# Patient Record
Sex: Male | Born: 1990 | Race: White | Hispanic: No | Marital: Single | Smoking: Never smoker
Health system: Southern US, Community
[De-identification: ages and names within clinical notes are randomized; demographics above are authoritative.]

## PROBLEM LIST (undated history)

## (undated) DIAGNOSIS — E669 Obesity, unspecified: Secondary | ICD-10-CM

## (undated) DIAGNOSIS — E785 Hyperlipidemia, unspecified: Secondary | ICD-10-CM

## (undated) DIAGNOSIS — K760 Fatty (change of) liver, not elsewhere classified: Secondary | ICD-10-CM

## (undated) DIAGNOSIS — I10 Essential (primary) hypertension: Secondary | ICD-10-CM

## (undated) DIAGNOSIS — G4730 Sleep apnea, unspecified: Secondary | ICD-10-CM

## (undated) DIAGNOSIS — E119 Type 2 diabetes mellitus without complications: Secondary | ICD-10-CM

## (undated) HISTORY — DX: Essential (primary) hypertension: I10

## (undated) HISTORY — DX: Hyperlipidemia, unspecified: E78.5

## (undated) HISTORY — DX: Fatty (change of) liver, not elsewhere classified: K76.0

## (undated) HISTORY — DX: Type 2 diabetes mellitus without complications: E11.9

## (undated) HISTORY — DX: Obesity, unspecified: E66.9

## (undated) HISTORY — DX: Sleep apnea, unspecified: G47.30

---

## 2016-03-28 HISTORY — PX: KNEE SURGERY: SHX244

## 2017-08-16 NOTE — Progress Notes (Signed)
Patient ID: Carlos Wilcox, male    DOB: 29-Apr-1991, 26 y.o.   MRN: [id number]  Chief Complaint  Patient presents with  . Nausea    Allergies Patient has no allergy information on record.  Subjective:   Carlos Wilcox is a 26 y.o. male who presents to [facility] today.  HPI Here to establish care and reports that over the past 16 days has not been feeling good. Reports that has felt nauseated for about three days. Had been out of work since 08/06/17. Works 12 hours shifts. Since that time has had nausea and diarrhea. The diarrhea would initially be 5-6 times a day and it has gotten better now. Just has the nausea and vomiting now. Starts to cough b/c feel nauseated and then vomits. Has been coughing a lot and gets pos-tussive emesis.  Saw Dr. Burnett Harry since this has started and was told had bronchitis, flu, and stomach virus. Was put on antibiotics. Has been taking cough syrup. Thinks he was put on ceftin. Was given phenergan suppository which made him sleepy. Did not do any testing of stool. Vomiting is only associated with coughing now. Now is not vomiting on a daily basis, yesterday had some dry heaves several times and a little bit of emesis. Has had a decreased appetite. Stomach does not hurt. No rashes. Does not hurt in chest.  Stool is back to normal. Stools is twice a day, formed. Nausea is about the same as when it started. The cough started back when the nausea and diarrhea started. Reports at Dr. Burnett Harry did a swab for flu and it was positive. He did a CXR and no pneumonia. They did not check blood work. Was written out of work from the 9th-20th and yesterday was the first day he was supposed to be back but has not been feeling well. Due to work constraints did not feel like could go back to work. The worst thing now is the coughing and the vomiting that comes from it. Does not want to vomit on or around an inmate.     History reviewed. No pertinent past medical  history.  Past Surgical History:  Procedure Laterality Date  . KNEE SURGERY  03/2016    Family History  Problem Relation Age of Onset  . Hypertension Mother   . Hypertension Father      Social History   Social History  . Marital status: Single    Spouse name: N/A  . Number of children: N/A  . Years of education: N/A   Occupational History  . Designer, industrial/product    Social History Main Topics  . Smoking status: Never Smoker  . Smokeless tobacco: Never Used  . Alcohol use Yes     Comment: social drinker. Less than 1 drink per week  . Drug use: No  . Sexual activity: No   Other Topics Concern  . None   Social History Narrative   Lives with parents. Grew up in [place], [place]. Worked at [employer] for a year. Eats all food groups.     Review of Systems  Constitutional: Positive for appetite change, chills and fatigue. Negative for fever and unexpected weight change.       Has been having some chills. No fevers.   HENT: Negative for ear pain, hearing loss, nosebleeds, sinus pressure and trouble swallowing.   Eyes: Negative for visual disturbance.  Respiratory: Positive for cough. Negative for choking, chest tightness, shortness of breath, wheezing and stridor.  Coughing up yellow/green mucus. The amount is better but persistent.   Cardiovascular: Negative for chest pain, palpitations and leg swelling.  Gastrointestinal: Positive for abdominal pain, nausea and vomiting. Negative for blood in stool, constipation and diarrhea.  Genitourinary: Negative for decreased urine volume, difficulty urinating, dysuria, frequency and urgency.  Musculoskeletal: Negative for arthralgias and back pain.  Skin: Negative for color change and rash.  Neurological: Negative for dizziness, tremors, weakness and light-headedness.  Hematological: Negative for adenopathy. Does not bruise/bleed easily.  Psychiatric/Behavioral: Negative for decreased concentration and dysphoric  mood. The patient is not nervous/anxious.      Objective:   BP 130/86 (BP Location: Left Arm, Patient Position: Sitting, Cuff Size: Normal)   Pulse 98   Temp 97.6 F (36.4 C) (Other (Comment))   Resp 16   Ht 6' 2"  (1.88 m)   Wt (!) 302 lb 12 oz (137.3 kg)   SpO2 100%   BMI 38.87 kg/m   Physical Exam  Constitutional: He is oriented to person, place, and time. He appears well-developed and well-nourished.  HENT:  Head: Normocephalic and atraumatic.  Right Ear: External ear normal.  Left Ear: External ear normal.  Nose: Nose normal.  Mouth/Throat: Oropharynx is clear and moist.  Eyes: Pupils are equal, round, and reactive to light. Conjunctivae and EOM are normal. No scleral icterus.  Neck: Normal range of motion. Neck supple. No JVD present. No tracheal deviation present. No thyromegaly present.  Cardiovascular: Normal rate, regular rhythm and normal heart sounds.   Pulses:      Dorsalis pedis pulses are 2+ on the right side, and 2+ on the left side.  Pulmonary/Chest: Effort normal and breath sounds normal. No stridor. He has no wheezes.  Abdominal: Soft. Bowel sounds are normal. He exhibits no distension and no mass. There is hepatosplenomegaly. There is tenderness. There is no rebound and no guarding.  Tenderness at the liver edge and below rib cage on the right side. Questionable spleen enlargement. Liver edge palpable and tender.   Musculoskeletal: Normal range of motion. He exhibits no edema.  Lymphadenopathy:    He has no cervical adenopathy.  Neurological: He is alert and oriented to person, place, and time.  Skin: Skin is warm, dry and intact. Capillary refill takes less than 2 seconds. No rash noted. No erythema. No pallor.  Psychiatric: He has a normal mood and affect. His behavior is normal. Thought content normal.  Vitals reviewed.    Assessment and Plan   1. Long discussion and review of patient symptoms. Uncertain etiology but suspect viral syndrome and  questionable liver/spleen enlargement. Possibly EBV or URI with some systemic manifestations. Patient unsure of initial antibiotic treatment, so will treat with zpack to cover for typical and atypical bacteria in light of exposures with job. Discussed with patient that lungs sound good and suspect resolving bronchitis with post-tussive emesis possibly exacerbated by nausea thereby increasing gag reflex. Will check for viral hepatitis. Due to questionable HSM, check u/s and check EBV serology.  Due to vomiting and family history, check blood sugars and electrolytes. Counseled regarding worrisome s/s and if develop to the ED. Patient to follow up on Friday or sooner if needed.   Nausea and vomiting, intractability of vomiting not specified, unspecified vomiting type  - Basic metabolic panel - CBC with Differential/Platelet - Hepatic function panel - Hepatitis panel, acute  2. Hepatosplenomegaly  - US Abdomen Complete; Future - Epstein-Barr virus VCA antibody panel  3. Cough - azithromycin (ZITHROMAX) 250 MG tablet;  Take two pills for the first day and then one pill a day for four days.  Dispense: 6 tablet; Refill: 0  4. Viral syndrome  - Epstein-Barr virus VCA antibody panel  5. Hyperglycemia  - Hemoglobin A1c    No Follow-up on file. Caren Macadam, MD 08/22/2017

## 2017-08-22 ENCOUNTER — Encounter: Payer: Self-pay | Admitting: Family Medicine

## 2017-08-22 ENCOUNTER — Other Ambulatory Visit (HOSPITAL_COMMUNITY)
Admission: RE | Admit: 2017-08-22 | Discharge: 2017-08-22 | Disposition: A | Discharge status: Home / Self Care | Payer: BC Managed Care – PPO | Source: Ambulatory Visit | Attending: Family Medicine | Admitting: Family Medicine

## 2017-08-22 ENCOUNTER — Ambulatory Visit (INDEPENDENT_AMBULATORY_CARE_PROVIDER_SITE_OTHER): Payer: BC Managed Care – PPO | Admitting: Family Medicine

## 2017-08-22 ENCOUNTER — Ambulatory Visit (HOSPITAL_COMMUNITY)
Admission: RE | Admit: 2017-08-22 | Discharge: 2017-08-22 | Disposition: A | Discharge status: Home / Self Care | Payer: BC Managed Care – PPO | Source: Ambulatory Visit | Attending: Family Medicine | Admitting: Family Medicine

## 2017-08-22 ENCOUNTER — Encounter (INDEPENDENT_AMBULATORY_CARE_PROVIDER_SITE_OTHER): Payer: Self-pay

## 2017-08-22 VITALS — BP 130/86 | HR 98 | Temp 97.6°F | Resp 16 | Ht 74.0 in | Wt 302.8 lb

## 2017-08-22 DIAGNOSIS — K760 Fatty (change of) liver, not elsewhere classified: Secondary | ICD-10-CM | POA: Insufficient documentation

## 2017-08-22 DIAGNOSIS — R112 Nausea with vomiting, unspecified: Secondary | ICD-10-CM

## 2017-08-22 DIAGNOSIS — B349 Viral infection, unspecified: Secondary | ICD-10-CM | POA: Diagnosis not present

## 2017-08-22 DIAGNOSIS — R05 Cough: Secondary | ICD-10-CM

## 2017-08-22 DIAGNOSIS — R059 Cough, unspecified: Secondary | ICD-10-CM

## 2017-08-22 DIAGNOSIS — R162 Hepatomegaly with splenomegaly, not elsewhere classified: Secondary | ICD-10-CM | POA: Insufficient documentation

## 2017-08-22 DIAGNOSIS — R739 Hyperglycemia, unspecified: Secondary | ICD-10-CM

## 2017-08-22 LAB — CBC WITH DIFFERENTIAL/PLATELET
Basophils Absolute: 0.1 10*3/uL (ref 0.0–0.1)
Basophils Relative: 1 %
Eosinophils Absolute: 0.6 10*3/uL (ref 0.0–0.7)
Eosinophils Relative: 7 %
HCT: 47.6 % (ref 39.0–52.0)
Hemoglobin: 16.7 g/dL (ref 13.0–17.0)
Lymphocytes Relative: 30 %
Lymphs Abs: 2.6 10*3/uL (ref 0.7–4.0)
MCH: 31.2 pg (ref 26.0–34.0)
MCHC: 35.1 g/dL (ref 30.0–36.0)
MCV: 88.8 fL (ref 78.0–100.0)
Monocytes Absolute: 0.9 10*3/uL (ref 0.1–1.0)
Monocytes Relative: 10 %
Neutro Abs: 4.7 10*3/uL (ref 1.7–7.7)
Neutrophils Relative %: 52 %
Platelets: 270 10*3/uL (ref 150–400)
RBC: 5.36 MIL/uL (ref 4.22–5.81)
RDW: 12.3 % (ref 11.5–15.5)
WBC: 8.9 10*3/uL (ref 4.0–10.5)

## 2017-08-22 LAB — BASIC METABOLIC PANEL
Anion gap: 9 (ref 5–15)
BUN: 11 mg/dL (ref 6–20)
CO2: 24 mmol/L (ref 22–32)
Calcium: 9.7 mg/dL (ref 8.9–10.3)
Chloride: 102 mmol/L (ref 101–111)
Creatinine, Ser: 0.83 mg/dL (ref 0.61–1.24)
GFR calc Af Amer: >60 mL/min (ref >60)
GFR calc non Af Amer: >60 mL/min (ref >60)
Glucose, Bld: 161 mg/dL — ABNORMAL HIGH (ref 65–99)
Potassium: 3.7 mmol/L (ref 3.5–5.1)
Sodium: 135 mmol/L (ref 135–145)

## 2017-08-22 LAB — HEMOGLOBIN A1C
Hgb A1c MFr Bld: 6.9 % — ABNORMAL HIGH (ref 4.8–5.6)
Mean Plasma Glucose: 151.33 mg/dL

## 2017-08-22 LAB — HEPATIC FUNCTION PANEL
ALT: 193 U/L — ABNORMAL HIGH (ref 17–63)
AST: 94 U/L — ABNORMAL HIGH (ref 15–41)
Albumin: 4.4 g/dL (ref 3.5–5.0)
Alkaline Phosphatase: 51 U/L (ref 38–126)
Bilirubin, Direct: 0.1 mg/dL (ref 0.1–0.5)
Indirect Bilirubin: 0.4 mg/dL (ref 0.3–0.9)
Total Bilirubin: 0.5 mg/dL (ref 0.3–1.2)
Total Protein: 8 g/dL (ref 6.5–8.1)

## 2017-08-22 MED ORDER — AZITHROMYCIN 250 MG PO TABS: ORAL_TABLET | ORAL | 0 refills | Status: DC

## 2017-08-23 ENCOUNTER — Telehealth: Payer: Self-pay | Admitting: Family Medicine

## 2017-08-23 LAB — HEPATITIS PANEL, ACUTE
HCV Ab: <0.1 s/co ratio (ref 0.0–0.9)
Hep A IgM: NEGATIVE
Hep B C IgM: NEGATIVE
Hepatitis B Surface Ag: NEGATIVE

## 2017-08-23 LAB — EPSTEIN-BARR VIRUS VCA ANTIBODY PANEL
EBV Early Antigen Ab, IgG: <9 U/mL (ref 0.0–8.9)
EBV NA IgG: <18 U/mL (ref 0.0–17.9)
EBV VCA IgG: <18 U/mL (ref 0.0–17.9)
EBV VCA IgM: <36 U/mL (ref 0.0–35.9)

## 2017-08-23 NOTE — Telephone Encounter (Signed)
Called patient regarding message below. No answer, left generic message for patient to return call.

## 2017-08-23 NOTE — Telephone Encounter (Signed)
-----   Message from Caren Macadam, MD sent at 08/23/2017  8:25 AM EDT ----- Please call patient and advise that his spleen is normal size. His liver is enlarged. His labs that are back do reveal that he has diabetes type 2. Please schedule him to follow up tomorrow to discuss his labs, fill out paperwork, and discuss the needed interventions. Gwen Her. Mannie Stabile, MD

## 2017-08-25 ENCOUNTER — Ambulatory Visit (HOSPITAL_COMMUNITY): Payer: BC Managed Care – PPO

## 2017-08-25 NOTE — Telephone Encounter (Signed)
Spoke to father, on [place], advised of labs and ask that he have patient call for appt

## 2017-08-30 ENCOUNTER — Ambulatory Visit (INDEPENDENT_AMBULATORY_CARE_PROVIDER_SITE_OTHER): Payer: BC Managed Care – PPO | Admitting: Family Medicine

## 2017-08-30 ENCOUNTER — Encounter: Payer: Self-pay | Admitting: Family Medicine

## 2017-08-30 VITALS — BP 126/82 | HR 82 | Resp 20 | Ht 74.0 in | Wt 301.0 lb

## 2017-08-30 DIAGNOSIS — E1165 Type 2 diabetes mellitus with hyperglycemia: Secondary | ICD-10-CM | POA: Diagnosis not present

## 2017-08-30 DIAGNOSIS — K760 Fatty (change of) liver, not elsewhere classified: Secondary | ICD-10-CM | POA: Diagnosis not present

## 2017-08-30 LAB — HEPATIC FUNCTION PANEL
AG Ratio: 1.8 (calc) (ref 1.0–2.5)
ALT: 226 U/L — ABNORMAL HIGH (ref 9–46)
AST: 139 U/L — ABNORMAL HIGH (ref 10–40)
Albumin: 4.6 g/dL (ref 3.6–5.1)
Alkaline phosphatase (APISO): 53 U/L (ref 40–115)
Bilirubin, Direct: 0.2 mg/dL (ref 0.0–0.2)
Globulin: 2.6 g/dL (calc) (ref 1.9–3.7)
Indirect Bilirubin: 0.8 mg/dL (calc) (ref 0.2–1.2)
Total Bilirubin: 1 mg/dL (ref 0.2–1.2)
Total Protein: 7.2 g/dL (ref 6.1–8.1)

## 2017-08-30 LAB — POC URINALSYSI DIPSTICK (AUTOMATED)
Bilirubin, UA: NEGATIVE
Blood, UA: NEGATIVE
Glucose, UA: NEGATIVE
Leukocytes, UA: NEGATIVE
Nitrite, UA: NEGATIVE
Protein, UA: NEGATIVE
Spec Grav, UA: 1.015 (ref 1.010–1.025)
Urobilinogen, UA: 0.2 E.U./dL
pH, UA: 5.5 (ref 5.0–8.0)

## 2017-08-30 LAB — POCT CBG (FASTING - GLUCOSE)-MANUAL ENTRY: Glucose Fasting, POC: 219 mg/dL — AB (ref 70–99)

## 2017-08-30 MED ORDER — METFORMIN HCL 500 MG PO TABS: ORAL_TABLET | ORAL | 1 refills | Status: DC

## 2017-08-30 NOTE — Progress Notes (Signed)
Patient ID: Carlos Wilcox, male    DOB: [birth year], 26 y.o.   MRN: [id number]  Chief Complaint  Patient presents with  . Results    going over labs    Allergies Patient has no allergy information on record.  Subjective:   Carlos Wilcox is a 26 y.o. male who presents to [facility] today.  HPI Here for follow up. Has been doing better. Has not been vomiting. Has not had diarrhea. Energy is better but still feels tired and sluggish. Has not been doing much. Sleeping during the day and stays up during the day b/c works night shift. Here to discuss labs. When I told patient that he was diabetic he said that he forgot to tell me that had been diagnosed with diabetes several years ago and had an episode of DKA and was hospitalized. Sugars at that time were in the 600s. Was on insulin for several months and then reports that his body recovered and he did not need it anymore. Was on metformin and took it and then sugars came down. Reports that he did not want that information given to his job so had left it off. Family history of diabetes on paternal side.  Eats a lot of frozen foods-eats taquitos, bagel bites, chicken alfredo. Weight has not been stable. Exercises three times a week until was not feeling well.    Diabetes  He presents for his initial diabetic visit. He has type 2 diabetes mellitus. No MedicAlert identification noted. His disease course has been fluctuating. Pertinent negatives for hypoglycemia include no confusion, dizziness, headaches, hunger, mood changes, nervousness/anxiousness, pallor, seizures, sleepiness or speech difficulty. Pertinent negatives for diabetes include no blurred vision, no chest pain, no fatigue, no foot paresthesias, no foot ulcerations, no polydipsia, no polyphagia, no polyuria, no visual change, no weakness and no weight loss. There are no hypoglycemic complications. There are no diabetic complications. Risk factors for coronary artery disease  include family history, diabetes mellitus, male sex, obesity and sedentary lifestyle. When asked about current treatments, none were reported. His weight is stable. He is following a generally healthy diet. When asked about meal planning, he reported none. He has not had a previous visit with a dietitian. He participates in exercise three times a week. An ACE inhibitor/angiotensin II receptor blocker is not being taken. He does not see a podiatrist.Eye exam is not current.    History reviewed. No pertinent past medical history.  Past Surgical History:  Procedure Laterality Date  . KNEE SURGERY  03/2016    Family History  Problem Relation Age of Onset  . Hypertension Mother   . Hypertension Father      Social History   Social History  . Marital status: Single    Spouse name: N/A  . Number of children: N/A  . Years of education: N/A   Occupational History  . Designer, industrial/product    Social History Main Topics  . Smoking status: Never Smoker  . Smokeless tobacco: Never Used  . Alcohol use Yes     Comment: social drinker. Less than 1 drink per week  . Drug use: No  . Sexual activity: No   Other Topics Concern  . None   Social History Narrative   Lives with parents. Grew up in [place], [place]. Worked at [employer] for a year. Eats all food groups.     Review of Systems  Constitutional: Negative for activity change, chills, diaphoresis, fatigue, fever, unexpected weight change and  weight loss.       Feels tired but reports that believes it is b/c he has not been doing anything. Supposed to go back to work today.   Eyes: Negative for blurred vision and visual disturbance.  Respiratory: Negative for cough, choking, chest tightness and shortness of breath.   Cardiovascular: Negative for chest pain, palpitations and leg swelling.  Gastrointestinal: Negative for abdominal distention, blood in stool, constipation, diarrhea, rectal pain and vomiting.       Nausea is  much better. Has not had it in days but had a twinge yesterday.   Endocrine: Negative for polydipsia, polyphagia and polyuria.  Genitourinary: Negative for dysuria.  Musculoskeletal: Negative for arthralgias, back pain and myalgias.  Skin: Negative for pallor and rash.  Neurological: Negative for dizziness, seizures, syncope, speech difficulty, weakness, light-headedness, numbness and headaches.  Hematological: Negative for adenopathy. Does not bruise/bleed easily.  Psychiatric/Behavioral: Negative for agitation, behavioral problems, confusion, dysphoric mood and sleep disturbance. The patient is not nervous/anxious.      Objective:   BP 126/82   Pulse 82   Resp 20   Ht 6' 2"  (1.88 m)   Wt (!) 301 lb (136.5 kg)   SpO2 98%   BMI 38.65 kg/m   Physical Exam  Constitutional: He is oriented to person, place, and time. He appears well-developed and well-nourished.  HENT:  Head: Normocephalic and atraumatic.  Nose: Nose normal.  Mouth/Throat: Oropharynx is clear and moist.  Eyes: Pupils are equal, round, and reactive to light. EOM are normal.  Neck: Normal range of motion. Neck supple. No JVD present. No tracheal deviation present. No thyromegaly present.  Cardiovascular: Normal rate, regular rhythm and normal heart sounds.  Exam reveals no friction rub.   No murmur heard. Pulmonary/Chest: Effort normal and breath sounds normal. No respiratory distress. He has no wheezes.  Abdominal: Soft. Bowel sounds are normal. He exhibits no distension and no mass. There is no tenderness. There is no rebound and no guarding.  Musculoskeletal: Normal range of motion. He exhibits no edema.  Lymphadenopathy:    He has no cervical adenopathy.  Neurological: He is alert and oriented to person, place, and time. No cranial nerve deficit. Coordination normal.  Skin: Skin is warm and dry.  Psychiatric: He has a normal mood and affect. His behavior is normal. Judgment and thought content normal.  Vitals  reviewed.    Assessment and Plan   1. Uncontrolled type 2 diabetes mellitus with hyperglycemia (HCC) Lifestyle modifications discussed with patient including a diet emphasizing vegetables, fruits, and whole grains. Limiting intake of sodium to less than 2,400 mg per day.  Recommendations discussed include consuming low-fat dairy products, poultry, fish, legumes, non-tropical vegetable oils, and nuts; and limiting intake of sweets, sugar-sweetened beverages, and red meat. Discussed following a plan such as the Dietary Approaches to Stop Hypertension (DASH) diet. Patient to read up on this diet.  Diet and weight loss discussed in detail. Start medication as directed and follow up as directed.  - metFORMIN (GLUCOPHAGE) 500 MG tablet; Take one pill a day for two weeks then increase one pill twice a day  Dispense: 60 tablet; Refill: 1 - Ambulatory referral to Nutrition and Diabetic Education - POCT CBG (Fasting - Glucose) - POCT Urinalysis Dipstick (Automated) Did POCT for urine an blood sugar to make sure that values were not to high. Patient will follow up in 1 month. At that time will check lipids, and do other diabetic testing. Gets flu shot at work.  Forms completed that he could return to work.  -Discussed metformin side effects. Will titrate up dose slowly to decrease GI side effects.  2. NAFLD (nonalcoholic fatty liver disease) Discussed the risks of this including liver failure due to this. Discussed that needs to loose weight, get sugars controlled, and monitor. Check labs today. U/S reviewed with patient.  - Hepatic function panel  3. Morbid obesity ([place]) Discussed. Dietary changes discussed.  - Ambulatory referral to Nutrition and Diabetic Education  4.  Recent Influenza/Viral syndrome: records reviewed from Dr. Burnett Harry. Discussed with patient. Will be scanned into chart.   Return in about 4 weeks (around 09/27/2017) for diabetes/liver. Caren Macadam, MD 08/30/2017

## 2017-08-30 NOTE — Patient Instructions (Signed)
Diabetes Mellitus and Food It is important for you to manage your blood sugar (glucose) level. Your blood glucose level can be greatly affected by what you eat. Eating healthier foods in the appropriate amounts throughout the day at about the same time each day will help you control your blood glucose level. It can also help slow or prevent worsening of your diabetes mellitus. Healthy eating may even help you improve the level of your blood pressure and reach or maintain a healthy weight. General recommendations for healthful eating and cooking habits include:  Eating meals and snacks regularly. Avoid going long periods of time without eating to lose weight.  Eating a diet that consists mainly of plant-based foods, such as fruits, vegetables, nuts, legumes, and whole grains.  Using low-heat cooking methods, such as baking, instead of high-heat cooking methods, such as deep frying.  Work with your dietitian to make sure you understand how to use the Nutrition Facts information on food labels. How can food affect me? Carbohydrates Carbohydrates affect your blood glucose level more than any other type of food. Your dietitian will help you determine how many carbohydrates to eat at each meal and teach you how to count carbohydrates. Counting carbohydrates is important to keep your blood glucose at a healthy level, especially if you are using insulin or taking certain medicines for diabetes mellitus. Alcohol Alcohol can cause sudden decreases in blood glucose (hypoglycemia), especially if you use insulin or take certain medicines for diabetes mellitus. Hypoglycemia can be a life-threatening condition. Symptoms of hypoglycemia (sleepiness, dizziness, and disorientation) are similar to symptoms of having too much alcohol. If your health care provider has given you approval to drink alcohol, do so in moderation and use the following guidelines:  Women should not have more than one drink per day, and men  should not have more than two drinks per day. One drink is equal to: ? 12 oz of beer. ? 5 oz of wine. ? 1 oz of hard liquor.  Do not drink on an empty stomach.  Keep yourself hydrated. Have water, diet soda, or unsweetened iced tea.  Regular soda, juice, and other mixers might contain a lot of carbohydrates and should be counted.  What foods are not recommended? As you make food choices, it is important to remember that all foods are not the same. Some foods have fewer nutrients per serving than other foods, even though they might have the same number of calories or carbohydrates. It is difficult to get your body what it needs when you eat foods with fewer nutrients. Examples of foods that you should avoid that are high in calories and carbohydrates but low in nutrients include:  Trans fats (most processed foods list trans fats on the Nutrition Facts label).  Regular soda.  Juice.  Candy.  Sweets, such as cake, pie, doughnuts, and cookies.  Fried foods.  What foods can I eat? Eat nutrient-rich foods, which will nourish your body and keep you healthy. The food you should eat also will depend on several factors, including:  The calories you need.  The medicines you take.  Your weight.  Your blood glucose level.  Your blood pressure level.  Your cholesterol level.  You should eat a variety of foods, including:  Protein. ? Lean cuts of meat. ? Proteins low in saturated fats, such as fish, egg whites, and beans. Avoid processed meats.  Fruits and vegetables. ? Fruits and vegetables that may help control blood glucose levels, such as apples,   mangoes, and yams.  Dairy products. ? Choose fat-free or low-fat dairy products, such as milk, yogurt, and cheese.  Grains, bread, pasta, and rice. ? Choose whole grain products, such as multigrain bread, whole oats, and brown rice. These foods may help control blood pressure.  Fats. ? Foods containing healthful fats, such as  nuts, avocado, olive oil, canola oil, and fish.  Does everyone with diabetes mellitus have the same meal plan? Because every person with diabetes mellitus is different, there is not one meal plan that works for everyone. It is very important that you meet with a dietitian who will help you create a meal plan that is just right for you. This information is not intended to replace advice given to you by your health care provider. Make sure you discuss any questions you have with your health care provider. Document Released: 08/11/2005 Document Revised: 04/21/2016 Document Reviewed: 10/11/2013 Elsevier Interactive Patient Education  2017 Elsevier Inc.  

## 2017-09-04 ENCOUNTER — Telehealth: Payer: Self-pay | Admitting: Family Medicine

## 2017-09-04 NOTE — Telephone Encounter (Signed)
Technically, I did not see him until 08/22/17 as a new patient visit. You can addend the note saying that he was under my care from that date. He had been seeing the previous physical before that I so, you can say he was under care from 9/20 by another physician.  Gwen Her. Mannie Stabile, MD

## 2017-09-04 NOTE — Telephone Encounter (Signed)
Patient is requesting an out of  work excuse note for 08/17/17-08/31/17 states he was under her care.  Cb#: [phone]

## 2017-09-04 NOTE — Telephone Encounter (Signed)
We gave him a letter to return to work already. Is it okay to addend the letter to include the dates requested.

## 2017-09-05 NOTE — Telephone Encounter (Signed)
Left message

## 2017-09-07 ENCOUNTER — Telehealth: Payer: Self-pay | Admitting: Family Medicine

## 2017-09-07 MED ORDER — GLIPIZIDE ER 2.5 MG PO TB24: 5.0000 mg | ORAL_TABLET | Freq: Every day | ORAL | 1 refills | Status: DC

## 2017-09-07 NOTE — Telephone Encounter (Signed)
Patient states he cant take metformin because it gives him diarrhea   Cb# [phone]

## 2017-09-07 NOTE — Telephone Encounter (Signed)
Call and advise to stop metformin and start glucotrol xl, 5 mg po in the morning. He works third shift, so advise to take whenever his morning is, but be consistent with dosing. Please advise to work on diet and exercise. Gwen Her. Mannie Stabile, MD

## 2017-09-11 ENCOUNTER — Encounter: Payer: Self-pay | Admitting: Family Medicine

## 2017-09-11 NOTE — Telephone Encounter (Signed)
Called patient regarding message below. No answer, left generic message for patient to return call.

## 2017-09-13 NOTE — Telephone Encounter (Signed)
Called patient regarding message below. No answer, left generic message for patient to return call.

## 2017-10-23 ENCOUNTER — Telehealth: Payer: Self-pay | Admitting: Family Medicine

## 2017-10-23 NOTE — Telephone Encounter (Signed)
Patient called in requesting a note for work for dates: 11-19/18,10/17/17,10/21/17, and 10/22/17  says he was not seen in the office, but he didn't go to work because the water was contaminated in [place] > I told him we do not provide work notes if you were not seen in the office nor was this medically related.  Let me know if I can do anything further. Cb#: [phone]

## 2017-10-24 NOTE — Telephone Encounter (Signed)
I will not give work note for a time period when he was not seen or evaluated at this office. Gwen Her. Mannie Stabile, MD

## 2017-11-13 ENCOUNTER — Observation Stay (HOSPITAL_COMMUNITY)
Admission: EM | Admit: 2017-11-13 | Discharge: 2017-11-14 | Disposition: A | Discharge status: Home / Self Care | Payer: BC Managed Care – PPO | Attending: General Surgery | Admitting: General Surgery

## 2017-11-13 ENCOUNTER — Emergency Department (HOSPITAL_COMMUNITY): Payer: BC Managed Care – PPO | Admitting: Anesthesiology

## 2017-11-13 ENCOUNTER — Encounter (HOSPITAL_COMMUNITY): Payer: Self-pay | Admitting: *Deleted

## 2017-11-13 ENCOUNTER — Encounter: Payer: Self-pay | Admitting: Family Medicine

## 2017-11-13 ENCOUNTER — Encounter (HOSPITAL_COMMUNITY)
Admission: EM | Disposition: A | Discharge status: Home / Self Care | Payer: Self-pay | Source: Home / Self Care | Attending: Emergency Medicine

## 2017-11-13 ENCOUNTER — Ambulatory Visit (INDEPENDENT_AMBULATORY_CARE_PROVIDER_SITE_OTHER): Payer: BC Managed Care – PPO | Admitting: Family Medicine

## 2017-11-13 ENCOUNTER — Other Ambulatory Visit: Payer: Self-pay

## 2017-11-13 ENCOUNTER — Emergency Department (HOSPITAL_COMMUNITY): Payer: BC Managed Care – PPO

## 2017-11-13 VITALS — BP 130/70 | HR 101 | Temp 99.5°F | Resp 16 | Ht 74.0 in | Wt 285.8 lb

## 2017-11-13 DIAGNOSIS — R1011 Right upper quadrant pain: Secondary | ICD-10-CM | POA: Diagnosis not present

## 2017-11-13 DIAGNOSIS — E1165 Type 2 diabetes mellitus with hyperglycemia: Secondary | ICD-10-CM

## 2017-11-13 DIAGNOSIS — N62 Hypertrophy of breast: Secondary | ICD-10-CM | POA: Insufficient documentation

## 2017-11-13 DIAGNOSIS — Z9889 Other specified postprocedural states: Secondary | ICD-10-CM | POA: Diagnosis not present

## 2017-11-13 DIAGNOSIS — Z6836 Body mass index (BMI) 36.0-36.9, adult: Secondary | ICD-10-CM | POA: Diagnosis not present

## 2017-11-13 DIAGNOSIS — K3580 Unspecified acute appendicitis: Principal | ICD-10-CM

## 2017-11-13 DIAGNOSIS — E119 Type 2 diabetes mellitus without complications: Secondary | ICD-10-CM | POA: Diagnosis not present

## 2017-11-13 DIAGNOSIS — Z8249 Family history of ischemic heart disease and other diseases of the circulatory system: Secondary | ICD-10-CM | POA: Diagnosis not present

## 2017-11-13 DIAGNOSIS — K760 Fatty (change of) liver, not elsewhere classified: Secondary | ICD-10-CM | POA: Diagnosis not present

## 2017-11-13 HISTORY — PX: LAPAROSCOPIC APPENDECTOMY: SHX408

## 2017-11-13 LAB — COMPREHENSIVE METABOLIC PANEL
ALT: 89 U/L — ABNORMAL HIGH (ref 17–63)
AST: 45 U/L — ABNORMAL HIGH (ref 15–41)
Albumin: 4.2 g/dL (ref 3.5–5.0)
Alkaline Phosphatase: 65 U/L (ref 38–126)
Anion gap: 9 (ref 5–15)
BUN: 8 mg/dL (ref 6–20)
CO2: 22 mmol/L (ref 22–32)
Calcium: 9.7 mg/dL (ref 8.9–10.3)
Chloride: 106 mmol/L (ref 101–111)
Creatinine, Ser: 0.74 mg/dL (ref 0.61–1.24)
GFR calc Af Amer: >60 mL/min (ref >60)
GFR calc non Af Amer: >60 mL/min (ref >60)
Glucose, Bld: 167 mg/dL — ABNORMAL HIGH (ref 65–99)
Potassium: 3.8 mmol/L (ref 3.5–5.1)
Sodium: 137 mmol/L (ref 135–145)
Total Bilirubin: 0.8 mg/dL (ref 0.3–1.2)
Total Protein: 8.2 g/dL — ABNORMAL HIGH (ref 6.5–8.1)

## 2017-11-13 LAB — CBC WITH DIFFERENTIAL/PLATELET
Basophils Absolute: 0.1 10*3/uL (ref 0.0–0.1)
Basophils Relative: 1 %
Eosinophils Absolute: 0.5 10*3/uL (ref 0.0–0.7)
Eosinophils Relative: 5 %
HCT: 49.4 % (ref 39.0–52.0)
Hemoglobin: 17 g/dL (ref 13.0–17.0)
Lymphocytes Relative: 14 %
Lymphs Abs: 1.4 10*3/uL (ref 0.7–4.0)
MCH: 31.8 pg (ref 26.0–34.0)
MCHC: 34.4 g/dL (ref 30.0–36.0)
MCV: 92.3 fL (ref 78.0–100.0)
Monocytes Absolute: 1.3 10*3/uL — ABNORMAL HIGH (ref 0.1–1.0)
Monocytes Relative: 13 %
Neutro Abs: 6.9 10*3/uL (ref 1.7–7.7)
Neutrophils Relative %: 69 %
Platelets: 245 10*3/uL (ref 150–400)
RBC: 5.35 MIL/uL (ref 4.22–5.81)
RDW: 12.3 % (ref 11.5–15.5)
WBC: 10 10*3/uL (ref 4.0–10.5)

## 2017-11-13 LAB — TYPE AND SCREEN
ABO/RH(D): A NEG
Antibody Screen: NEGATIVE

## 2017-11-13 LAB — GLUCOSE, CAPILLARY: Glucose-Capillary: 153 mg/dL — ABNORMAL HIGH (ref 65–99)

## 2017-11-13 LAB — PROTIME-INR
INR: 1.01
Prothrombin Time: 13.2 seconds (ref 11.4–15.2)

## 2017-11-13 SURGERY — APPENDECTOMY, LAPAROSCOPIC
Anesthesia: General

## 2017-11-13 MED ORDER — ROCURONIUM BROMIDE 100 MG/10ML IV SOLN
INTRAVENOUS | Status: DC | PRN
  Administered 2017-11-13: 5 mg via INTRAVENOUS
  Administered 2017-11-13: 35 mg via INTRAVENOUS

## 2017-11-13 MED ORDER — HYDROMORPHONE HCL 1 MG/ML IJ SOLN: 1.0000 mg | INTRAMUSCULAR | Status: DC | PRN

## 2017-11-13 MED ORDER — FENTANYL CITRATE (PF) 250 MCG/5ML IJ SOLN
INTRAMUSCULAR | Status: AC
  Filled 2017-11-13: qty 5

## 2017-11-13 MED ORDER — SODIUM CHLORIDE 0.9 % IR SOLN
Status: DC | PRN
  Administered 2017-11-13: 1000 mL

## 2017-11-13 MED ORDER — LIDOCAINE HCL (CARDIAC) 10 MG/ML IV SOLN
INTRAVENOUS | Status: DC | PRN
  Administered 2017-11-13: 50 mg via INTRAVENOUS

## 2017-11-13 MED ORDER — BUPIVACAINE HCL (PF) 0.5 % IJ SOLN
INTRAMUSCULAR | Status: DC | PRN
  Administered 2017-11-13: 30 mL

## 2017-11-13 MED ORDER — ONDANSETRON HCL 4 MG/2ML IJ SOLN
INTRAMUSCULAR | Status: DC | PRN
  Administered 2017-11-13: 4 mg via INTRAVENOUS

## 2017-11-13 MED ORDER — PROPOFOL 10 MG/ML IV BOLUS
INTRAVENOUS | Status: DC | PRN
  Administered 2017-11-13: 100 mg via INTRAVENOUS
  Administered 2017-11-13: 200 mg via INTRAVENOUS

## 2017-11-13 MED ORDER — GLYCOPYRROLATE 0.2 MG/ML IJ SOLN
INTRAMUSCULAR | Status: AC
  Filled 2017-11-13: qty 2

## 2017-11-13 MED ORDER — INSULIN ASPART 100 UNIT/ML ~~LOC~~ SOLN: 0.0000 [IU] | Freq: Three times a day (TID) | SUBCUTANEOUS | Status: DC

## 2017-11-13 MED ORDER — SODIUM CHLORIDE 0.9 % IV BOLUS (SEPSIS)
500.0000 mL | Freq: Once | INTRAVENOUS | Status: AC
  Administered 2017-11-13: 500 mL via INTRAVENOUS

## 2017-11-13 MED ORDER — ROCURONIUM BROMIDE 50 MG/5ML IV SOLN
INTRAVENOUS | Status: AC
  Filled 2017-11-13: qty 1

## 2017-11-13 MED ORDER — BUPIVACAINE HCL (PF) 0.5 % IJ SOLN
INTRAMUSCULAR | Status: AC
  Filled 2017-11-13: qty 30

## 2017-11-13 MED ORDER — POVIDONE-IODINE 10 % EX OINT
TOPICAL_OINTMENT | CUTANEOUS | Status: AC
  Filled 2017-11-13: qty 1

## 2017-11-13 MED ORDER — NEOSTIGMINE METHYLSULFATE 10 MG/10ML IV SOLN
INTRAVENOUS | Status: DC | PRN
  Administered 2017-11-13: 4 mg via INTRAVENOUS

## 2017-11-13 MED ORDER — GLYCOPYRROLATE 0.2 MG/ML IJ SOLN
INTRAMUSCULAR | Status: DC | PRN
  Administered 2017-11-13: .8 mg via INTRAVENOUS

## 2017-11-13 MED ORDER — KETOROLAC TROMETHAMINE 30 MG/ML IJ SOLN
30.0000 mg | Freq: Once | INTRAMUSCULAR | Status: AC
  Administered 2017-11-13: 30 mg via INTRAVENOUS
  Filled 2017-11-13: qty 1

## 2017-11-13 MED ORDER — MIDAZOLAM HCL 2 MG/2ML IJ SOLN
INTRAMUSCULAR | Status: DC | PRN
  Administered 2017-11-13: 2 mg via INTRAVENOUS

## 2017-11-13 MED ORDER — IOPAMIDOL (ISOVUE-300) INJECTION 61%
100.0000 mL | Freq: Once | INTRAVENOUS | Status: AC | PRN
  Administered 2017-11-13: 100 mL via INTRAVENOUS

## 2017-11-13 MED ORDER — ACETAMINOPHEN 325 MG PO TABS: 650.0000 mg | ORAL_TABLET | Freq: Four times a day (QID) | ORAL | Status: DC | PRN

## 2017-11-13 MED ORDER — PIPERACILLIN-TAZOBACTAM 3.375 G IVPB
3.3750 g | Freq: Three times a day (TID) | INTRAVENOUS | Status: DC
  Administered 2017-11-14: 3.375 g via INTRAVENOUS
  Filled 2017-11-13: qty 50

## 2017-11-13 MED ORDER — OXYCODONE-ACETAMINOPHEN 5-325 MG PO TABS: 1.0000 | ORAL_TABLET | ORAL | Status: DC | PRN

## 2017-11-13 MED ORDER — FENTANYL CITRATE (PF) 100 MCG/2ML IJ SOLN
INTRAMUSCULAR | Status: DC | PRN
  Administered 2017-11-13: 100 ug via INTRAVENOUS
  Administered 2017-11-13 (×5): 50 ug via INTRAVENOUS

## 2017-11-13 MED ORDER — PIPERACILLIN-TAZOBACTAM 3.375 G IVPB 30 MIN
3.3750 g | Freq: Once | INTRAVENOUS | Status: AC
  Administered 2017-11-13: 3.375 g via INTRAVENOUS
  Filled 2017-11-13: qty 50

## 2017-11-13 MED ORDER — POVIDONE-IODINE 10 % OINT PACKET
TOPICAL_OINTMENT | CUTANEOUS | Status: DC | PRN
  Administered 2017-11-13: 1 via TOPICAL

## 2017-11-13 MED ORDER — MIDAZOLAM HCL 2 MG/2ML IJ SOLN
INTRAMUSCULAR | Status: AC
  Filled 2017-11-13: qty 2

## 2017-11-13 MED ORDER — SODIUM CHLORIDE 0.9 % IV SOLN
INTRAVENOUS | Status: DC | PRN
  Administered 2017-11-13: 20:00:00 via INTRAVENOUS

## 2017-11-13 MED ORDER — SIMETHICONE 80 MG PO CHEW: 40.0000 mg | CHEWABLE_TABLET | Freq: Four times a day (QID) | ORAL | Status: DC | PRN

## 2017-11-13 MED ORDER — SUCCINYLCHOLINE CHLORIDE 20 MG/ML IJ SOLN
INTRAMUSCULAR | Status: DC | PRN
  Administered 2017-11-13: 160 mg via INTRAVENOUS

## 2017-11-13 MED ORDER — ACETAMINOPHEN 650 MG RE SUPP: 650.0000 mg | Freq: Four times a day (QID) | RECTAL | Status: DC | PRN

## 2017-11-13 MED ORDER — FENTANYL CITRATE (PF) 100 MCG/2ML IJ SOLN
INTRAMUSCULAR | Status: AC
  Filled 2017-11-13: qty 2

## 2017-11-13 MED ORDER — ENOXAPARIN SODIUM 40 MG/0.4ML ~~LOC~~ SOLN: 40.0000 mg | Freq: Every day | SUBCUTANEOUS | Status: DC

## 2017-11-13 MED ORDER — LACTATED RINGERS IV SOLN
INTRAVENOUS | Status: DC
  Administered 2017-11-13: via INTRAVENOUS

## 2017-11-13 MED ORDER — NEOSTIGMINE METHYLSULFATE 10 MG/10ML IV SOLN
INTRAVENOUS | Status: AC
  Filled 2017-11-13: qty 1

## 2017-11-13 MED ORDER — ONDANSETRON HCL 4 MG/2ML IJ SOLN: 4.0000 mg | Freq: Four times a day (QID) | INTRAMUSCULAR | Status: DC | PRN

## 2017-11-13 MED ORDER — ONDANSETRON 4 MG PO TBDP: 4.0000 mg | ORAL_TABLET | Freq: Four times a day (QID) | ORAL | Status: DC | PRN

## 2017-11-13 SURGICAL SUPPLY — 47 items
BAG HAMPER (MISCELLANEOUS) ×2 IMPLANT
BAG RETRIEVAL 10 (BASKET) ×1
CHLORAPREP W/TINT 26ML (MISCELLANEOUS) ×2 IMPLANT
CLOTH BEACON ORANGE TIMEOUT ST (SAFETY) ×2 IMPLANT
COVER LIGHT HANDLE STERIS (MISCELLANEOUS) ×4 IMPLANT
CUTTER FLEX LINEAR 45M (STAPLE) ×2 IMPLANT
DECANTER SPIKE VIAL GLASS SM (MISCELLANEOUS) ×2 IMPLANT
DERMABOND ADVANCED (GAUZE/BANDAGES/DRESSINGS) ×2
DERMABOND ADVANCED .7 DNX12 (GAUZE/BANDAGES/DRESSINGS) ×2 IMPLANT
ELECT REM PT RETURN 9FT ADLT (ELECTROSURGICAL) ×2
ELECTRODE REM PT RTRN 9FT ADLT (ELECTROSURGICAL) ×1 IMPLANT
EVACUATOR SMOKE 8.L (FILTER) ×2 IMPLANT
FORMALIN 10 PREFIL 120ML (MISCELLANEOUS) ×2 IMPLANT
GLOVE BIOGEL PI IND STRL 7.0 (GLOVE) ×2 IMPLANT
GLOVE BIOGEL PI INDICATOR 7.0 (GLOVE) ×2
GLOVE SURG SS PI 7.5 STRL IVOR (GLOVE) ×2 IMPLANT
GOWN STRL REUS W/ TWL XL LVL3 (GOWN DISPOSABLE) ×1 IMPLANT
GOWN STRL REUS W/TWL LRG LVL3 (GOWN DISPOSABLE) ×2 IMPLANT
GOWN STRL REUS W/TWL XL LVL3 (GOWN DISPOSABLE) ×1
INDICATOR FLASH STER (STERILIZATION PRODUCTS) IMPLANT
INST SET LAPROSCOPIC AP (KITS) ×2 IMPLANT
IV NS IRRIG 3000ML ARTHROMATIC (IV SOLUTION) IMPLANT
KIT ROOM TURNOVER APOR (KITS) ×2 IMPLANT
MANIFOLD NEPTUNE II (INSTRUMENTS) ×2 IMPLANT
NEEDLE INSUFFLATION 14GA 120MM (NEEDLE) ×2 IMPLANT
NS IRRIG 1000ML POUR BTL (IV SOLUTION) ×2 IMPLANT
PACK LAP CHOLE LZT030E (CUSTOM PROCEDURE TRAY) ×2 IMPLANT
PAD ARMBOARD 7.5X6 YLW CONV (MISCELLANEOUS) ×2 IMPLANT
PENCIL HANDSWITCHING (ELECTRODE) ×2 IMPLANT
RELOAD 45 VASCULAR/THIN (ENDOMECHANICALS) IMPLANT
RELOAD STAPLE TA45 3.5 REG BLU (ENDOMECHANICALS) IMPLANT
SET BASIN LINEN APH (SET/KITS/TRAYS/PACK) ×2 IMPLANT
SET TUBE IRRIG SUCTION NO TIP (IRRIGATION / IRRIGATOR) IMPLANT
SHEARS HARMONIC ACE PLUS 36CM (ENDOMECHANICALS) ×2 IMPLANT
SPONGE GAUZE 2X2 8PLY STRL LF (GAUZE/BANDAGES/DRESSINGS) ×6 IMPLANT
STAPLER VISISTAT (STAPLE) ×2 IMPLANT
SUT MNCRL AB 4-0 PS2 18 (SUTURE) ×4 IMPLANT
SUT VICRYL 0 UR6 27IN ABS (SUTURE) ×2 IMPLANT
SYS BAG RETRIEVAL 10MM (BASKET) ×1
SYSTEM BAG RETRIEVAL 10MM (BASKET) ×1 IMPLANT
TRAY FOLEY CATH SILVER 16FR (SET/KITS/TRAYS/PACK) ×2 IMPLANT
TROCAR ENDO BLADELESS 11MM (ENDOMECHANICALS) ×2 IMPLANT
TROCAR ENDO BLADELESS 12MM (ENDOMECHANICALS) ×2 IMPLANT
TROCAR XCEL NON-BLD 5MMX100MML (ENDOMECHANICALS) ×2 IMPLANT
TUBING INSUFFLATION (TUBING) ×2 IMPLANT
WARMER LAPAROSCOPE (MISCELLANEOUS) ×2 IMPLANT
YANKAUER SUCT 12FT TUBE ARGYLE (SUCTIONS) ×2 IMPLANT

## 2017-11-13 NOTE — Anesthesia Procedure Notes (Signed)
Procedure Name: Intubation Date/Time: 11/13/2017 7:53 PM Performed by: Vista Deck, CRNA Pre-anesthesia Checklist: Patient identified, Patient being monitored, Timeout performed, Emergency Drugs available and Suction available Patient Re-evaluated:Patient Re-evaluated prior to induction Oxygen Delivery Method: Circle System Utilized Preoxygenation: Pre-oxygenation with 100% oxygen Induction Type: IV induction, Rapid sequence and Cricoid Pressure applied Laryngoscope Size: Mac and 4 Grade View: Grade I Tube type: Oral Tube size: 7.0 mm Number of attempts: 1 Airway Equipment and Method: stylet and Oral airway Placement Confirmation: ETT inserted through vocal cords under direct vision,  positive ETCO2 and breath sounds checked- equal and bilateral Secured at: 23 cm Tube secured with: Tape Dental Injury: Teeth and Oropharynx as per pre-operative assessment

## 2017-11-13 NOTE — ED Provider Notes (Signed)
Emergency Department Provider Note   I have reviewed the triage vital signs and the nursing notes.   HISTORY  Chief Complaint Abdominal Pain   HPI Carlos Wilcox is a 26 y.o. male with PMH of DM presents to the emergency department for evaluation of abdominal pain starting 2 days prior.  Patient describes the pain at that time is diffuse and more epigastric to periumbilical.  Sunday evening the pain began migrating to the right side of the abdomen where it has continued to cause pain.  Prior to pain onset the patient had migraine symptoms with nausea and vomiting but those have resolved.  He saw his primary care physician today who referred him to the emergency department for further evaluation of abdominal pain.  Patient is having bowel movements.  He last ate at 4 AM today. Patient not anticoagulated. No prior surgical history.    History reviewed. No pertinent past medical history.  Patient Active Problem List   Diagnosis Date Noted  . Uncontrolled type 2 diabetes mellitus with hyperglycemia ([place]) 08/30/2017  . NAFLD (nonalcoholic fatty liver disease) 08/30/2017  . Morbid obesity ([place]) 08/30/2017    Past Surgical History:  Procedure Laterality Date  . KNEE SURGERY  03/2016      Allergies Patient has no known allergies.  Family History  Problem Relation Age of Onset  . Hypertension Mother   . Hypertension Father     Social History Social History   Tobacco Use  . Smoking status: Never Smoker  . Smokeless tobacco: Never Used  Substance Use Topics  . Alcohol use: Yes    Comment: social drinker. Less than 1 drink per week  . Drug use: No    Review of Systems  Constitutional: No fever/chills Eyes: No visual changes. ENT: No sore throat. Cardiovascular: Denies chest pain. Respiratory: Denies shortness of breath. Gastrointestinal: Positive abdominal pain.  No nausea, no vomiting.  No diarrhea.  No constipation. Genitourinary: Negative for  dysuria. Musculoskeletal: Negative for back pain. Skin: Negative for rash. Neurological: Negative for headaches, focal weakness or numbness.  10-point ROS otherwise negative.  ____________________________________________   PHYSICAL EXAM:  VITAL SIGNS: ED Triage Vitals  Enc Vitals Group     BP 11/13/17 1235 (!) 155/96     Pulse Rate 11/13/17 1235 (!) 108     Resp 11/13/17 1235 18     Temp 11/13/17 1235 98.7 F (37.1 C)     Temp Source 11/13/17 1235 Oral     SpO2 11/13/17 1235 96 %     Pain Score 11/13/17 1234 6   Constitutional: Alert and oriented. Well appearing and in no acute distress. Eyes: Conjunctivae are normal.  Head: Atraumatic. Nose: No congestion/rhinnorhea. Mouth/Throat: Mucous membranes are moist.  Oropharynx non-erythematous. Neck: No stridor.  Cardiovascular: Normal rate, regular rhythm. Good peripheral circulation. Grossly normal heart sounds.   Respiratory: Normal respiratory effort.  No retractions. Lungs CTAB. Gastrointestinal: Soft  No distention.  Musculoskeletal: No lower extremity tenderness nor edema. No gross deformities of extremities. Neurologic:  Normal speech and language. No gross focal neurologic deficits are appreciated.  Skin:  Skin is warm, dry and intact. No rash noted.  ____________________________________________   LABS (all labs ordered are listed, but only abnormal results are displayed)  Labs Reviewed  COMPREHENSIVE METABOLIC PANEL - Abnormal; Notable for the following components:      Result Value   Glucose, Bld 167 (*)    Total Protein 8.2 (*)    AST 45 (*)  ALT 89 (*)    All other components within normal limits  CBC WITH DIFFERENTIAL/PLATELET - Abnormal; Notable for the following components:   Monocytes Absolute 1.3 (*)    All other components within normal limits  PROTIME-INR  TYPE AND SCREEN   ____________________________________________  RADIOLOGY  Ct Abdomen Pelvis W Contrast  Result Date:  11/13/2017 CLINICAL DATA:  Right lower quadrant pain for the past 2 days. EXAM: CT ABDOMEN AND PELVIS WITH CONTRAST TECHNIQUE: Multidetector CT imaging of the abdomen and pelvis was performed using the standard protocol following bolus administration of intravenous contrast. CONTRAST:  133m ISOVUE-300 IOPAMIDOL (ISOVUE-300) INJECTION 61% COMPARISON:  Abdominal ultrasound dated August 22, 2017. FINDINGS: Lower chest: No acute abnormality.  Bilateral gynecomastia. Hepatobiliary: Diffuse hepatic steatosis. No focal liver abnormality. The gallbladder is unremarkable. No biliary dilatation. Pancreas: Unremarkable. No pancreatic ductal dilatation or surrounding inflammatory changes. Spleen: Normal in size without focal abnormality. Adrenals/Urinary Tract: The right adrenal gland is unremarkable. There is an indeterminate 2.4 cm low-density nodule in the left adrenal gland. Kidneys are normal, without renal calculi, focal lesion, or hydronephrosis. Bladder is unremarkable. Stomach/Bowel: The stomach, small bowel, and colon are unremarkable. Dilatation of the proximal appendix with mild surrounding inflammatory changes, consistent with acute appendicitis. Appendix: Location: Inferior to the cecum. Diameter: 11 mm. Appendicolith: None. Mucosal hyper-enhancement: None. Extraluminal gas: None. Periappendiceal collection: None. Vascular/Lymphatic: No significant vascular findings are present. Reactive right lower quadrant mesenteric lymph nodes. No enlarged abdominal or pelvic lymph nodes. Reproductive: Prostate is unremarkable. Other: No abdominal wall hernia or abnormality. No abdominopelvic ascites. No pneumoperitoneum. Musculoskeletal: No acute or significant osseous findings. Mild wedging of several lower thoracic vertebral bodies may reflect sequelae of Scheuermann's disease. IMPRESSION: 1. Findings consistent with acute uncomplicated appendicitis. 2. Indeterminate 2.4 cm low-density nodule in the left adrenal gland  is probably benign and favored to represent an adenoma. However, given the lesion's size, a non-emergent adrenal protocol CT is recommended for further evaluation. This recommendation follows ACR consensus guidelines: Management of Incidental Adrenal Masses: A White Paper of the ACR Incidental Findings Committee. J Am Coll Radiol 2017;14:1038-1044. 3. Diffuse hepatic steatosis. Electronically Signed   By: WTitus DubinM.D.   On: 11/13/2017 16:58    ____________________________________________   PROCEDURES  Procedure(s) performed:   Procedures  None ____________________________________________   INITIAL IMPRESSION / ASSESSMENT AND PLAN / ED COURSE  Pertinent labs & imaging results that were available during my care of the patient were reviewed by me and considered in my medical decision making (see chart for details).  Patient presents the emergency permit for evaluation of right-sided abdominal pain starting Saturday but has since progressed to the right lower quadrant.  He has focal tenderness in this area with no rebound but does have some voluntary guarding.  Afebrile. Last PO was 4 AM today.   CT shows non-complicated acute appendicitis. Starting Zosyn and will add additional pre-op labs. Updated the patient at bedside and answered all questions.   Discussed patient's case with General Surgery, Dr. JArnoldo Moraleto request admission. Patient and family (if present) updated with plan. Care transferred to Surgery service.  I reviewed all nursing notes, vitals, pertinent old records, EKGs, labs, imaging (as available).  ____________________________________________  FINAL CLINICAL IMPRESSION(S) / ED DIAGNOSES  Final diagnoses:  Acute appendicitis, unspecified acute appendicitis type     MEDICATIONS GIVEN DURING THIS VISIT:  Medications  piperacillin-tazobactam (ZOSYN) IVPB 3.375 g (3.375 g Intravenous New Bag/Given 11/13/17 1722)  sodium chloride 0.9 % bolus 500 mL (  0 mLs  Intravenous Stopped 11/13/17 1410)  iopamidol (ISOVUE-300) 61 % injection 100 mL (100 mLs Intravenous Contrast Given 11/13/17 1624)     Note:  This document was prepared using Dragon voice recognition software and may include unintentional dictation errors.  Nanda Quinton, MD Emergency Medicine    Long, [facility], MD 11/13/17 [phone]

## 2017-11-13 NOTE — Progress Notes (Signed)
Patient ID: Martrell Eguia, male    DOB: 14-Apr-1991, 26 y.o.   MRN: [id number]  Chief Complaint  Patient presents with  . Pain    Right side    Allergies Patient has no allergy information on record.  Subjective:   Demetrus Pavao is a 26 y.o. male who presents to [facility] today.  HPI Yidel presents today to the office with complaint of abdominal pain.  He reports that he woke up on 12/13 (Thursday) with migraine headache, nausea, and episode of vomiting. Slept most of the day and subsequently had to call out of work. Then, the next day, still had headache but no n/v/d. The next day (Saturday) had no headache or n/v/d and felt better. Martin Majestic out with friend and went to mall/movies/and out to eat at [employer]  After getting home that evening he developed pain in the center of his abdomen which bothered him but he was able to go to sleep without much difficulty. Then yesterday, felt pain in abdomen again but went from the center of stomach/abdomen to the RUQ.  Comes in today because of this pain.  He reports that on his way here this morning he did have one episode of emesis.  Has felt sweaty and mildly nauseated.  Has not been able to take the glipizide b/c it causes him to have diarrhea. Reports that cannot work with the diarrhea.    Abdominal Pain  This is a new problem. The current episode started in the past 7 days. The problem occurs constantly. The most recent episode lasted 3 days. The problem has been unchanged. The pain is located in the RLQ. The pain is at a severity of 6/10. The pain is moderate. The quality of the pain is dull. The abdominal pain does not radiate. Associated symptoms include anorexia, diarrhea and vomiting. Pertinent negatives include no constipation, dysuria, fever, frequency, headaches, hematuria or nausea. Associated symptoms comments: Had one episode of diarrhea today. Loose, unformed stool. Usually BM are solid, two a day.. Nothing  (coughing) aggravates the pain. The pain is relieved by nothing. Treatments tried: Tired gas relief medicaiton  The treatment provided no relief. There is no history of abdominal surgery, colon cancer, gallstones, GERD, irritable bowel syndrome, pancreatitis, PUD or ulcerative colitis.    No past medical history on file.  Past Surgical History:  Procedure Laterality Date  . KNEE SURGERY  03/2016    Family History  Problem Relation Age of Onset  . Hypertension Mother   . Hypertension Father      Social History   Socioeconomic History  . Marital status: Single    Spouse name: None  . Number of children: None  . Years of education: None  . Highest education level: None  Social Needs  . Financial resource strain: None  . Food insecurity - worry: None  . Food insecurity - inability: None  . Transportation needs - medical: None  . Transportation needs - non-medical: None  Occupational History  . Occupation: Designer, industrial/product  Tobacco Use  . Smoking status: Never Smoker  . Smokeless tobacco: Never Used  Substance and Sexual Activity  . Alcohol use: Yes    Comment: social drinker. Less than 1 drink per week  . Drug use: No  . Sexual activity: No  Other Topics Concern  . None  Social History Narrative   Lives with parents. Grew up in [place], [place]. Worked at [employer] for a year. Eats all food  groups.     Review of Systems  Constitutional: Positive for appetite change. Negative for activity change, chills, fatigue, fever and unexpected weight change.       Has lost 16 pounds but has been working on diet and eating better.   HENT: Negative for nosebleeds, postnasal drip, rhinorrhea, sinus pressure, sinus pain, sore throat, trouble swallowing and voice change.   Eyes: Negative for visual disturbance.  Respiratory: Negative for cough, choking, chest tightness, shortness of breath, wheezing and stridor.        Has been coughing for the past couple months. No  sputum production. No SOB. Has lost 16 pounds.   Cardiovascular: Negative for chest pain and leg swelling.  Gastrointestinal: Positive for abdominal pain, anorexia, diarrhea and vomiting. Negative for abdominal distention, blood in stool, constipation and nausea.  Endocrine: Negative for polyphagia and polyuria.  Genitourinary: Negative for difficulty urinating, discharge, dysuria, frequency, hematuria and testicular pain.  Skin: Negative for rash.  Neurological: Positive for numbness. Negative for dizziness, tremors, syncope, light-headedness and headaches.  Hematological: Negative for adenopathy. Does not bruise/bleed easily.  Psychiatric/Behavioral: Negative for dysphoric mood. The patient is not nervous/anxious.      Objective:   BP 130/70 (BP Location: Left Arm, Patient Position: Sitting, Cuff Size: Normal)   Pulse (!) 101   Temp 99.5 F (37.5 C) (Other (Comment))   Resp 16   Ht 6' 2"  (1.88 m)   Wt 285 lb 12 oz (129.6 kg)   SpO2 97%   BMI 36.69 kg/m   Physical Exam  Constitutional: He is oriented to person, place, and time. He appears well-developed and well-nourished.  Pale appearing male, diaphoretic but in no acute distress.  HENT:  Head: Normocephalic and atraumatic.  Right Ear: External ear normal.  Left Ear: External ear normal.  Mouth/Throat: No oropharyngeal exudate.  Tonsils erythematous and slightly enlarged.  Evidence of postnasal drip.  Eyes: EOM are normal. Pupils are equal, round, and reactive to light. No scleral icterus.  Neck: Normal range of motion. Neck supple. No JVD present. No tracheal deviation present.  Cardiovascular: Normal rate, regular rhythm and normal heart sounds.  Pulmonary/Chest: Effort normal and breath sounds normal. No respiratory distress. He has no wheezes.  Abdominal: Soft. Bowel sounds are normal. There is no hepatosplenomegaly. There is tenderness in the right upper quadrant. There is guarding. There is no rigidity, no rebound and no  CVA tenderness.  Musculoskeletal: He exhibits no edema.  Lymphadenopathy:    He has no cervical adenopathy.  Neurological: He is alert and oriented to person, place, and time. No cranial nerve deficit.  Skin: Skin is warm and dry.  Psychiatric: He has a normal mood and affect. His behavior is normal.  Vitals reviewed.    Assessment and Plan   1. Pain, abdominal, RUQ  26 year old male with known type 2 diabetes mellitus, quit his medication several months ago but with subsequent dietary changes and 15 pound weight loss who presents with 3-day history of abdominal pain.  Has a history of known hepatic steatosis, elevated liver enzymes, and obesity.  Abdominal pain worsening in quality over the past 24 hours with associated nausea, vomiting, and low-grade temperature. Currently with guarding and worsening pain with examination.  Believe at this time that patient needs laboratory testing and CT scan of the abdomen to rule out cholecystitis or other intra-abdominal pathology. Recommend at this time that patient seek evaluation in the emergency department.  He defers transfer to the emergency department by EMS.  He reports he will go to the emergency department across the street at this time. He does agree to follow-up for evaluation of his diabetes and blood pressure.   Return in about 2 weeks (around 11/27/2017) for Follow-up for diabetes.Caren Macadam, MD 11/13/2017

## 2017-11-13 NOTE — Anesthesia Preprocedure Evaluation (Signed)
Anesthesia Evaluation  Patient identified by MRN, date of birth, ID band Patient awake    Reviewed: Allergy & Precautions, NPO status , Patient's Chart, lab work & pertinent test results  Airway Mallampati: II  TM Distance: >3 FB Neck ROM: Full    Dental  (+) Teeth Intact   Pulmonary    breath sounds clear to auscultation       Cardiovascular Exercise Tolerance: Good  Rhythm:Regular     Neuro/Psych    GI/Hepatic   Endo/Other  diabetes, Type 2  Renal/GU      Musculoskeletal   Abdominal   Peds  Hematology   Anesthesia Other Findings   Reproductive/Obstetrics                             Anesthesia Physical Anesthesia Plan  ASA: II and emergent  Anesthesia Plan: General   Post-op Pain Management:    Induction: Intravenous, Rapid sequence and Cricoid pressure planned  PONV Risk Score and Plan:   Airway Management Planned: Oral ETT  Additional Equipment:   Intra-op Plan:   Post-operative Plan: Extubation in OR  Informed Consent: I have reviewed the patients History and Physical, chart, labs and discussed the procedure including the risks, benefits and alternatives for the proposed anesthesia with the patient or authorized representative who has indicated his/her understanding and acceptance.   Dental advisory given  Plan Discussed with: Surgeon and Anesthesiologist  Anesthesia Plan Comments:         Anesthesia Quick Evaluation

## 2017-11-13 NOTE — H&P (Signed)
Carlos Wilcox is an 26 y.o. male.   Chief Complaint: Acute appendicitis HPI: Patient is a 26 year old white male who has a 2-day history of worsening right lower quadrant abdominal pain.  It started in the periumbilical region but now has moved to the right lower quadrant.  He states he has a pain of 4 out of 10.  It is made worse with movement.  No fever, chills, jaundice have been noted.  He does have a decreased appetite.  CT scan of the abdomen was performed in the emergency room which revealed acute appendicitis.  History reviewed. No pertinent past medical history.  Past Surgical History:  Procedure Laterality Date  . KNEE SURGERY  03/2016    Family History  Problem Relation Age of Onset  . Hypertension Mother   . Hypertension Father    Social History:  reports that  has never smoked. he has never used smokeless tobacco. He reports that he drinks alcohol. He reports that he does not use drugs.  Allergies: No Known Allergies   (Not in a hospital admission)  Results for orders placed or performed during the hospital encounter of 11/13/17 (from the past 48 hour(s))  Comprehensive metabolic panel     Status: Abnormal   Collection Time: 11/13/17 12:00 PM  Result Value Ref Range   Sodium 137 135 - 145 mmol/L   Potassium 3.8 3.5 - 5.1 mmol/L   Chloride 106 101 - 111 mmol/L   CO2 22 22 - 32 mmol/L   Glucose, Bld 167 (H) 65 - 99 mg/dL   BUN 8 6 - 20 mg/dL   Creatinine, Ser 0.74 0.61 - 1.24 mg/dL   Calcium 9.7 8.9 - 10.3 mg/dL   Total Protein 8.2 (H) 6.5 - 8.1 g/dL   Albumin 4.2 3.5 - 5.0 g/dL   AST 45 (H) 15 - 41 U/L   ALT 89 (H) 17 - 63 U/L   Alkaline Phosphatase 65 38 - 126 U/L   Total Bilirubin 0.8 0.3 - 1.2 mg/dL   GFR calc non Af Amer >60 >60 mL/min   GFR calc Af Amer >60 >60 mL/min    Comment: (NOTE) The eGFR has been calculated using the CKD EPI equation. This calculation has not been validated in all clinical situations. eGFR's persistently <60 mL/min signify  possible Chronic Kidney Disease.    Anion gap 9 5 - 15  CBC with Differential     Status: Abnormal   Collection Time: 11/13/17 12:00 PM  Result Value Ref Range   WBC 10.0 4.0 - 10.5 K/uL   RBC 5.35 4.22 - 5.81 MIL/uL   Hemoglobin 17.0 13.0 - 17.0 g/dL   HCT 49.4 39.0 - 52.0 %   MCV 92.3 78.0 - 100.0 fL   MCH 31.8 26.0 - 34.0 pg   MCHC 34.4 30.0 - 36.0 g/dL   RDW 12.3 11.5 - 15.5 %   Platelets 245 150 - 400 K/uL   Neutrophils Relative % 69 %   Neutro Abs 6.9 1.7 - 7.7 K/uL   Lymphocytes Relative 14 %   Lymphs Abs 1.4 0.7 - 4.0 K/uL   Monocytes Relative 13 %   Monocytes Absolute 1.3 (H) 0.1 - 1.0 K/uL   Eosinophils Relative 5 %   Eosinophils Absolute 0.5 0.0 - 0.7 K/uL   Basophils Relative 1 %   Basophils Absolute 0.1 0.0 - 0.1 K/uL  Protime-INR     Status: None   Collection Time: 11/13/17  5:25 PM  Result Value Ref Range  Prothrombin Time 13.2 11.4 - 15.2 seconds   INR 1.01   Type and screen [facility]     Status: None (Preliminary result)   Collection Time: 11/13/17  5:25 PM  Result Value Ref Range   ABO/RH(D) A NEG    Antibody Screen PENDING    Sample Expiration 11/16/2017    Ct Abdomen Pelvis W Contrast  Result Date: 11/13/2017 CLINICAL DATA:  Right lower quadrant pain for the past 2 days. EXAM: CT ABDOMEN AND PELVIS WITH CONTRAST TECHNIQUE: Multidetector CT imaging of the abdomen and pelvis was performed using the standard protocol following bolus administration of intravenous contrast. CONTRAST:  150m ISOVUE-300 IOPAMIDOL (ISOVUE-300) INJECTION 61% COMPARISON:  Abdominal ultrasound dated August 22, 2017. FINDINGS: Lower chest: No acute abnormality.  Bilateral gynecomastia. Hepatobiliary: Diffuse hepatic steatosis. No focal liver abnormality. The gallbladder is unremarkable. No biliary dilatation. Pancreas: Unremarkable. No pancreatic ductal dilatation or surrounding inflammatory changes. Spleen: Normal in size without focal abnormality. Adrenals/Urinary  Tract: The right adrenal gland is unremarkable. There is an indeterminate 2.4 cm low-density nodule in the left adrenal gland. Kidneys are normal, without renal calculi, focal lesion, or hydronephrosis. Bladder is unremarkable. Stomach/Bowel: The stomach, small bowel, and colon are unremarkable. Dilatation of the proximal appendix with mild surrounding inflammatory changes, consistent with acute appendicitis. Appendix: Location: Inferior to the cecum. Diameter: 11 mm. Appendicolith: None. Mucosal hyper-enhancement: None. Extraluminal gas: None. Periappendiceal collection: None. Vascular/Lymphatic: No significant vascular findings are present. Reactive right lower quadrant mesenteric lymph nodes. No enlarged abdominal or pelvic lymph nodes. Reproductive: Prostate is unremarkable. Other: No abdominal wall hernia or abnormality. No abdominopelvic ascites. No pneumoperitoneum. Musculoskeletal: No acute or significant osseous findings. Mild wedging of several lower thoracic vertebral bodies may reflect sequelae of Scheuermann's disease. IMPRESSION: 1. Findings consistent with acute uncomplicated appendicitis. 2. Indeterminate 2.4 cm low-density nodule in the left adrenal gland is probably benign and favored to represent an adenoma. However, given the lesion's size, a non-emergent adrenal protocol CT is recommended for further evaluation. This recommendation follows ACR consensus guidelines: Management of Incidental Adrenal Masses: A White Paper of the ACR Incidental Findings Committee. J Am Coll Radiol 2017;14:1038-1044. 3. Diffuse hepatic steatosis. Electronically Signed   By: WTitus DubinM.D.   On: 11/13/2017 16:58    Review of Systems  Constitutional: Positive for malaise/fatigue.  HENT: Negative.   Eyes: Negative.   Respiratory: Negative.   Cardiovascular: Negative.   Gastrointestinal: Positive for abdominal pain.  Genitourinary: Negative.   Musculoskeletal: Negative.   Skin: Negative.    Neurological: Negative.   Endo/Heme/Allergies: Negative.   Psychiatric/Behavioral: Negative.     Blood pressure (!) 146/98, pulse 89, temperature 98.7 F (37.1 C), temperature source Oral, resp. rate 15, SpO2 98 %. Physical Exam  Vitals reviewed. Constitutional: He is oriented to person, place, and time. He appears well-developed and well-nourished.  HENT:  Head: Normocephalic and atraumatic.  Cardiovascular: Normal rate, regular rhythm and normal heart sounds. Exam reveals no gallop and no friction rub.  No murmur heard. Respiratory: Effort normal and breath sounds normal. No respiratory distress. He has no wheezes. He has no rales.  GI: Soft. Bowel sounds are normal. He exhibits no distension. There is tenderness. There is guarding. There is no rebound.  Tender in the right lower quadrant to palpation.  No rigidity noted.  Neurological: He is alert and oriented to person, place, and time.  Skin: Skin is warm and dry.    CT scan report reviewed. Assessment/Plan Impression: Acute appendicitis Plan:  Patient will be taken to the operating room for laparoscopic appendectomy.  The risks and benefits of the procedure including bleeding, infection, and the possibility of an open procedure were fully explained to the patient, who gave informed consent.  Aviva Signs, MD 11/13/2017, 6:42 PM

## 2017-11-13 NOTE — ED Triage Notes (Signed)
Pt c/o RLQ abdominal pain since Saturday night. Pt reports n/v/d that started Thursday and ended Saturday morning. Denies n/v/d and fever at this time.

## 2017-11-13 NOTE — Anesthesia Postprocedure Evaluation (Signed)
Anesthesia Post Note  Patient: Carlos Wilcox  Procedure(s) Performed: APPENDECTOMY LAPAROSCOPIC (N/A )  Patient location during evaluation: PACU Anesthesia Type: General Level of consciousness: awake and alert and patient cooperative Pain management: satisfactory to patient Vital Signs Assessment: post-procedure vital signs reviewed and stable Respiratory status: spontaneous breathing Cardiovascular status: stable Postop Assessment: no apparent nausea or vomiting and adequate PO intake Anesthetic complications: no     Last Vitals:  Vitals:   11/13/17 2100 11/13/17 2115  BP: 140/85 (!) 117/99  Pulse: 80 (!) 53  Resp: (!) 25 17  Temp:    SpO2: 96% 96%    Last Pain:  Vitals:   11/13/17 2050  TempSrc:   PainSc: 2                  Misty Foutz

## 2017-11-13 NOTE — Op Note (Signed)
Patient:  Carlos Wilcox  DOB:  [birth year]  MRN:  [id number]   Preop Diagnosis: Acute appendicitis  Postop Diagnosis: Same  Procedure: Laparoscopic appendectomy  Surgeon: Aviva Signs, MD  Anes: General  endotracheal  Indications: Patient is a 26 year old white male who presents with a 48-hour history of worsening right lower quadrant abdominal pain.  CT scan of the abdomen revealed acute appendicitis.  The risks and benefits of the procedure including bleeding, infection, and the possibility of an open procedure were fully explained to the patient, who gave informed consent.  Procedure note: The patient was placed in supine position.  After induction of general endotracheal anesthesia, the abdomen was prepped and draped using the usual sterile technique with DuraPrep.  Surgical site confirmation was performed.  The supraumbilical incision was made down to the fascia.  A Veress needle was introduced into the abdominal cavity and confirmation of placement was done using saline drop test.  The abdomen was then insufflated to 16 mmHg pressure.  An 11 mm trocar was introduced into the abdominal cavity under direct visualization without difficulty.  The patient was placed in deeper Trendelenburg position and an additional 12 mm trocar was placed in the suprapubic region and a 5 mm trocar was placed left lower quadrant region.  The appendix was visualized and noted that its distal half was acutely inflamed.  Mesoappendix was divided using the harmonic scalpel.  The base of the appendix was found at the cecum.  A vascular Endo GIA was placed across the base of the appendix and fired.  The appendix was then removed using an Endo Catch bag without difficulty.  The staple line was inspected and noted to be within normal limits.  All fluid and air were then evacuated from the abdominal cavity prior to removal of the trochars.  All wounds were irrigated with normal saline.  All wounds were injected with 0.5%  Sensorcaine.  The suprapubic fascia was reapproximated using 0 Vicryl interrupted suture.  All skin incisions were closed using a 4-0 Monocryl subcuticular suture.  Dermabond was applied.  All tape and needle counts were correct at the end of the procedure.  The patient was extubated in the operating room and transferred to PACU in stable condition.  Complications: None  EBL: Minimal  Specimen: Appendix

## 2017-11-13 NOTE — Transfer of Care (Signed)
Immediate Anesthesia Transfer of Care Note  Patient: Carlos Wilcox  Procedure(s) Performed: APPENDECTOMY LAPAROSCOPIC (N/A )  Patient Location: PACU  Anesthesia Type:General  Level of Consciousness: awake and patient cooperative  Airway & Oxygen Therapy: Patient Spontanous Breathing and non-rebreather face mask  Post-op Assessment: Report given to RN and Post -op Vital signs reviewed and stable  Post vital signs: Reviewed and stable  Last Vitals:  Vitals:   11/13/17 1830 11/13/17 1851  BP: (!) 146/98 (!) 146/98  Pulse: 89 94  Resp: 15   Temp:  37.1 C  SpO2: 98% 98%    Last Pain:  Vitals:   11/13/17 1851  TempSrc:   PainSc: 0-No pain         Complications: No apparent anesthesia complications

## 2017-11-14 ENCOUNTER — Encounter (HOSPITAL_COMMUNITY): Payer: Self-pay | Admitting: *Deleted

## 2017-11-14 LAB — CBC
HCT: 46.8 % (ref 39.0–52.0)
Hemoglobin: 15.5 g/dL (ref 13.0–17.0)
MCH: 31.1 pg (ref 26.0–34.0)
MCHC: 33.1 g/dL (ref 30.0–36.0)
MCV: 94 fL (ref 78.0–100.0)
Platelets: 229 10*3/uL (ref 150–400)
RBC: 4.98 MIL/uL (ref 4.22–5.81)
RDW: 12.3 % (ref 11.5–15.5)
WBC: 12.2 10*3/uL — ABNORMAL HIGH (ref 4.0–10.5)

## 2017-11-14 LAB — BASIC METABOLIC PANEL
Anion gap: 10 (ref 5–15)
BUN: 8 mg/dL (ref 6–20)
CO2: 25 mmol/L (ref 22–32)
Calcium: 9.1 mg/dL (ref 8.9–10.3)
Chloride: 105 mmol/L (ref 101–111)
Creatinine, Ser: 0.86 mg/dL (ref 0.61–1.24)
GFR calc Af Amer: >60 mL/min (ref >60)
GFR calc non Af Amer: >60 mL/min (ref >60)
Glucose, Bld: 116 mg/dL — ABNORMAL HIGH (ref 65–99)
Potassium: 4 mmol/L (ref 3.5–5.1)
Sodium: 140 mmol/L (ref 135–145)

## 2017-11-14 LAB — GLUCOSE, CAPILLARY: Glucose-Capillary: 124 mg/dL — ABNORMAL HIGH (ref 65–99)

## 2017-11-14 MED ORDER — HYDROCODONE-ACETAMINOPHEN 5-325 MG PO TABS: 1.0000 | ORAL_TABLET | ORAL | 0 refills | Status: DC | PRN

## 2017-11-14 NOTE — Discharge Instructions (Signed)
Laparoscopic Appendectomy, Adult, Care After These instructions give you information about caring for yourself after your procedure. Your doctor may also give you more specific instructions. Call your doctor if you have any problems or questions after your procedure. Follow these instructions at home: Medicines  Take over-the-counter and prescription medicines only as told by your doctor.  Do not drive for 24 hours if you received a sedative.  Do not drive or use heavy machinery while taking prescription pain medicine.  If you were prescribed an antibiotic medicine, take it as told by your doctor. Do not stop taking it even if you start to feel better. Activity  Do not lift anything that is heavier than 10 pounds (4.5 kg) for 3 weeks or as told by your doctor.  Do not play contact sports for 3 weeks or as told by your doctor.  Slowly return to your normal activities. Bathing  Keep your cuts from surgery (incisions) clean and dry. ? Gently wash the cuts with soap and water. ? Rinse the cuts with water until the soap is gone. ? Pat the cuts dry with a clean towel. Do not rub the cuts.  You may take showers after 48 hours.  Do not take baths, swim, or use a hot tub for 2 weeks or as told by your doctor. Cut Care  Follow instructions from your doctor about how to take care of your cuts. Make sure you: ? Wash your hands with soap and water before you change your bandage (dressing). If you do not have soap and water, use hand sanitizer. ? Change your bandage as told by your doctor. ? Leave stitches (sutures), skin glue, or skin tape (adhesive) strips in place. They may need to stay in place for 2 weeks or longer. If tape strips get loose and curl up, you may trim the loose edges. Do not remove tape strips completely unless your doctor says it is okay.  Check your cuts every day for signs of infection. Check for: ? More redness, swelling, or pain. ? More fluid or  blood. ? Warmth. ? Pus or a bad smell. Other Instructions  If you were sent home with a drain, follow instructions from your doctor about how to use it and care for it.  Take deep breaths. This helps to keep your lungs from getting swollen (inflamed).  To help with constipation: ? Drink plenty of fluids. ? Eat plenty of fruits and vegetables.  Keep all follow-up visits as told by your doctor. This is important. Contact a doctor if:  You have more redness, swelling, or pain around a cut from surgery.  You have more fluid or blood coming from a cut.  Your cut feels warm to the touch.  You have pus or a bad smell coming from a cut or a bandage.  The edges of a cut break open after the stitches have been taken out.  You have pain in your shoulders that gets worse.  You feel dizzy or you pass out (faint).  You have shortness of breath.  You keep feeling sick to your stomach (nauseous).  You keep throwing up (vomiting).  You get diarrhea or you cannot control your poop.  You lose your appetite.  You have swelling or pain in your legs. Get help right away if:  You have a fever.  You get a rash.  You have trouble breathing.  You have sharp pains in your chest. This information is not intended to replace advice given  to you by your health care provider. Make sure you discuss any questions you have with your health care provider. Document Released: 09/10/2009 Document Revised: 04/21/2016 Document Reviewed: 05/04/2015 Elsevier Interactive Patient Education  2018 [employer].

## 2017-11-14 NOTE — Discharge Summary (Signed)
Physician Discharge Summary  Patient ID: Carlos Wilcox MRN: [id number] DOB/AGE: 1990/12/12 26 y.o.  Admit date: 11/13/2017 Discharge date: 11/14/2017  Admission Diagnoses: Acute appendicitis  Discharge Diagnoses: Same Active Problems:   Acute appendicitis Non-insulin-dependent diabetes mellitus  Discharged Condition: good  Hospital Course: Patient is a 26 year old white male who presented to the emergency room with a 2-day history of lower abdominal pain.  CT scan of the abdomen revealed acute appendicitis.  The patient was taken to the operating room on 11/13/2017 and underwent a laparoscopic appendectomy.  He tolerated the procedure well.  His postoperative course was unremarkable.  His diet was advanced without difficulty.  The patient is being discharged home on 11/14/2017 in good and improving condition.  Treatments: surgery: Laparoscopic appendectomy on 11/13/2017  Discharge Exam: Blood pressure 129/74, pulse 63, temperature 99 F (37.2 C), temperature source Oral, resp. rate 16, SpO2 99 %. General appearance: alert, cooperative and no distress Resp: clear to auscultation bilaterally Cardio: regular rate and rhythm, S1, S2 normal, no murmur, click, rub or gallop GI: Soft, incisions healing well.  Disposition: Home  Discharge Instructions    Diet Carb Modified   Complete by:  As directed    Increase activity slowly   Complete by:  As directed      Allergies as of 11/14/2017   No Known Allergies     Medication List    TAKE these medications   HYDROcodone-acetaminophen 5-325 MG tablet Commonly known as:  NORCO Take 1 tablet by mouth every 4 (four) hours as needed for moderate pain.      Follow-up Information    Aviva Signs, MD. Schedule an appointment as soon as possible for a visit on 11/30/2017.   Specialty:  General Surgery Contact information: [id number] [address] [phone]           Signed: Aviva Signs 11/14/2017,  7:20 AM

## 2017-11-14 NOTE — Progress Notes (Signed)
Carlos Wilcox discharged Home per MD order.  Discharge instructions reviewed and discussed with the patient, all questions and concerns answered. Copy of instructions and scripts given to patient.  Allergies as of 11/14/2017   No Known Allergies     Medication List    TAKE these medications   HYDROcodone-acetaminophen 5-325 MG tablet Commonly known as:  NORCO Take 1 tablet by mouth every 4 (four) hours as needed for moderate pain.       Patients skin is clean, dry and intact, no evidence of skin break down. IV site discontinued and catheter remains intact. Site without signs and symptoms of complications. Dressing and pressure applied.  Patient escorted to car by NT in a wheelchair,  no distress noted upon discharge.  Ralene Muskrat Shirley Bolle 11/14/2017 3:26 PM

## 2017-11-30 ENCOUNTER — Ambulatory Visit (INDEPENDENT_AMBULATORY_CARE_PROVIDER_SITE_OTHER): Payer: Self-pay | Admitting: General Surgery

## 2017-11-30 ENCOUNTER — Ambulatory Visit: Payer: BC Managed Care – PPO | Admitting: Family Medicine

## 2017-11-30 ENCOUNTER — Other Ambulatory Visit: Payer: Self-pay

## 2017-11-30 ENCOUNTER — Encounter: Payer: Self-pay | Admitting: Family Medicine

## 2017-11-30 ENCOUNTER — Encounter: Payer: Self-pay | Admitting: General Surgery

## 2017-11-30 VITALS — BP 146/80 | HR 107 | Temp 98.4°F | Resp 15 | Ht 74.0 in | Wt 291.5 lb

## 2017-11-30 VITALS — BP 145/91 | HR 86 | Temp 98.0°F | Ht 74.0 in | Wt 292.0 lb

## 2017-11-30 DIAGNOSIS — Z23 Encounter for immunization: Secondary | ICD-10-CM

## 2017-11-30 DIAGNOSIS — Z09 Encounter for follow-up examination after completed treatment for conditions other than malignant neoplasm: Secondary | ICD-10-CM

## 2017-11-30 DIAGNOSIS — K760 Fatty (change of) liver, not elsewhere classified: Secondary | ICD-10-CM | POA: Diagnosis not present

## 2017-11-30 DIAGNOSIS — Z9049 Acquired absence of other specified parts of digestive tract: Secondary | ICD-10-CM

## 2017-11-30 DIAGNOSIS — I10 Essential (primary) hypertension: Secondary | ICD-10-CM

## 2017-11-30 DIAGNOSIS — E1165 Type 2 diabetes mellitus with hyperglycemia: Secondary | ICD-10-CM | POA: Diagnosis not present

## 2017-11-30 MED ORDER — ATORVASTATIN CALCIUM 20 MG PO TABS: 20.0000 mg | ORAL_TABLET | Freq: Every day | ORAL | 3 refills | Status: DC

## 2017-11-30 MED ORDER — LOSARTAN POTASSIUM 50 MG PO TABS: 50.0000 mg | ORAL_TABLET | Freq: Every day | ORAL | 3 refills | Status: DC

## 2017-11-30 NOTE — Progress Notes (Signed)
Patient ID: Carlos Wilcox, male    DOB: 1991-08-28, 27 y.o.   MRN: [id number]  Chief Complaint  Patient presents with  . Follow-up    Allergies Patient has no known allergies.  Subjective:   Carlos Wilcox is a 27 y.o. male who presents to [facility] today.  HPI Here for a follow up visit.  At his last office visit,  he was sent to the emergency department for abdominal pain and subsequently found to have appendicitis and had a laparoscopic appendectomy.  Has an appointment with surgery today regarding release back to work and follow up. Was told that it would be about three weeks before he could go back to work. Surgery was done by Dr. Arnoldo Morale.  He reports that he is eating better. No nausea. Energy getting better. No fevers. BM are normal. No abdominal pain. Reports that he is able to get around and do his normal activities. Gained six pounds from last OV.   Is also here to follow-up on his diabetes and have a recheck of his blood pressure.  Prior to the episode of appendicitis he had not been taking medications for his diabetes.  He reportedly had been working on his diet and trying to lose weight.  He reports he did cut down on his frozen food intake and was lifting free weights and getting more exercise at work.  He denies any symptoms from his diabetes.  No lesions on his feet.  He has not had a flu shot or pneumonia shot.  Has not had a diabetic eye exam.  Has a family history of diabetes.  Reports that usually if he gets his weight under 300 pounds his blood sugars get a lot better.  Has never been on any medication for blood pressure control.  However review of office visits do show elevated blood pressures in the past.  Reports he would be interested in going to see a diabetic nutritionist.  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  Reports he  would like to be able to get his blood sugars under better control by diet and weight loss.  Reports he has had his vaccinations for hepatitis a and B.  Has a history of elevated liver function and hepatic steatosis.     Hypertension  This is a new problem. The current episode started more than 1 month ago. The problem has been waxing and waning since onset. The problem is uncontrolled. Pertinent negatives include no anxiety, blurred vision, chest pain, headaches, malaise/fatigue, neck pain, palpitations, peripheral edema, PND, shortness of breath or sweats. There are no associated agents to hypertension. Risk factors for coronary artery disease include diabetes mellitus, family history, obesity and male gender. Past treatments include lifestyle changes. The current treatment provides no improvement. There is no history of angina, kidney disease, CAD/MI, CVA, heart failure, left ventricular hypertrophy, PVD or retinopathy.  Diabetes  He presents for his follow-up diabetic visit. He has type 2 diabetes mellitus. MedicAlert identification noted. His disease course has been fluctuating. There are no hypoglycemic associated symptoms. Pertinent negatives for hypoglycemia include no dizziness, headaches, hunger, nervousness/anxiousness, seizures, sweats or tremors. Associated symptoms include weight loss. Pertinent negatives for diabetes include no blurred vision, no chest pain, no fatigue, no foot paresthesias, no foot ulcerations, no polydipsia, no polyphagia, no polyuria, no visual change and no  weakness. There are no hypoglycemic complications. Symptoms are improving. Pertinent negatives for diabetic complications include no CVA, peripheral neuropathy, PVD or retinopathy. Current diabetic treatment includes diet. He is compliant with treatment some of the time. His weight is decreasing steadily. He is following a generally healthy diet. When asked about meal planning, he reported none. He has not had a  previous visit with a dietitian. He participates in exercise intermittently. An ACE inhibitor/angiotensin II receptor blocker is not being taken. He does not see a podiatrist.Eye exam is not current.    No past medical history on file.  Past Surgical History:  Procedure Laterality Date  . KNEE SURGERY  03/2016  . LAPAROSCOPIC APPENDECTOMY N/A 11/13/2017   Procedure: APPENDECTOMY LAPAROSCOPIC;  Surgeon: Aviva Signs, MD;  Location: [facility];  Service: General;  Laterality: N/A;    Family History  Problem Relation Age of Onset  . Hypertension Mother   . Hypertension Father      Social History   Socioeconomic History  . Marital status: Single    Spouse name: None  . Number of children: None  . Years of education: None  . Highest education level: None  Social Needs  . Financial resource strain: None  . Food insecurity - worry: None  . Food insecurity - inability: None  . Transportation needs - medical: None  . Transportation needs - non-medical: None  Occupational History  . Occupation: Designer, industrial/product  Tobacco Use  . Smoking status: Never Smoker  . Smokeless tobacco: Never Used  Substance and Sexual Activity  . Alcohol use: Yes    Comment: social drinker. Less than 1 drink per week  . Drug use: No  . Sexual activity: No  Other Topics Concern  . None  Social History Narrative   Lives with parents. Grew up in [place], [place]. Worked at [employer] for a year. Eats all food groups.     Review of Systems  Constitutional: Positive for weight loss. Negative for chills, diaphoresis, fatigue and malaise/fatigue.  HENT: Negative for trouble swallowing.   Eyes: Negative for blurred vision and visual disturbance.  Respiratory: Negative for cough, shortness of breath and stridor.   Cardiovascular: Negative for chest pain, palpitations and PND.  Gastrointestinal: Negative for blood in stool, constipation, diarrhea and nausea.  Endocrine: Negative for  polydipsia, polyphagia and polyuria.  Genitourinary: Negative for dysuria, frequency and testicular pain.  Musculoskeletal: Negative for arthralgias, back pain and neck pain.  Neurological: Negative for dizziness, tremors, seizures, syncope, weakness, light-headedness and headaches.  Hematological: Negative for adenopathy. Does not bruise/bleed easily.  Psychiatric/Behavioral: Negative for dysphoric mood and sleep disturbance. The patient is not nervous/anxious.      Objective:   BP (!) 146/80 (BP Location: Left Arm, Patient Position: Sitting, Cuff Size: Normal)   Pulse (!) 107   Temp 98.4 F (36.9 C) (Other (Comment))   Resp 15   Ht 6' 2"  (1.88 m)   Wt 291 lb 8 oz (132.2 kg)   SpO2 98%   BMI 37.43 kg/m   Physical Exam  Constitutional: He is oriented to person, place, and time. He appears well-developed and well-nourished.  HENT:  Head: Normocephalic and atraumatic.  Eyes: Conjunctivae and EOM are normal. Pupils are equal, round, and reactive to light.  Neck: Normal range of motion. Neck supple. No JVD present.  Cardiovascular: Normal rate, regular rhythm and normal heart sounds.  Pulmonary/Chest: Effort normal and breath sounds normal.  Abdominal: Soft. Bowel sounds are normal. He exhibits no  distension. There is no tenderness. There is no guarding.  Neurological: He is alert and oriented to person, place, and time.  Skin: Skin is warm and dry.  Psychiatric: He has a normal mood and affect. His behavior is normal. Judgment and thought content normal.  Vitals reviewed.    Assessment and Plan   1. Uncontrolled type 2 diabetes mellitus with hyperglycemia ([place]) Patient would like to work on diabetes with diet control and weight loss. Will refer for diet counseling at this time. The patient is asked to make an attempt to improve diet and exercise patterns to aid in medical management of this problem. -Referral for diabetic eye exam. Perform foot exam at follow up.  - Hemoglobin  A1c - Lipid panel - Microalbumin / creatinine urine ratio - atorvastatin (LIPITOR) 20 MG tablet; Take 1 tablet (20 mg total) by mouth daily.  Dispense: 90 tablet; Refill: 3 - Referral to Nutrition and Diabetes Services  2. HTN, goal below 130/80 Lifestyle modifications discussed with patient including a diet emphasizing vegetables, fruits, and whole grains. Limiting intake of sodium to less than 2,400 mg per day.  Recommendations discussed include consuming low-fat dairy products, poultry, fish, legumes, non-tropical vegetable oils, and nuts; and limiting intake of sweets, sugar-sweetened beverages, and red meat. Discussed following a plan such as the Dietary Approaches to Stop Hypertension (DASH) diet. Patient to read up on this diet.  -Recheck blood pressure in 1 month. - Basic metabolic panel - losartan (COZAAR) 50 MG tablet; Take 1 tablet (50 mg total) by mouth daily.  Dispense: 90 tablet; Refill: 3 -Calculate need for aspirin at follow-up and ASCVD risk. 3. Morbid obesity ([place]) Weight loss discussed with patient and congratulated on weight loss.   4. Status post laparoscopic appendectomy Follow up with Dr. Arnoldo Morale to[place] and release of work per his office.   5. NAFLD (nonalcoholic fatty liver disease) Counseling given to patient and risk of ending up with liver disease.  - Hepatic function panel  6. Immunization due Tdap at follow up.  - Pneumococcal polysaccharide vaccine 23-valent greater than or equal to 2yo subcutaneous/IM - Flu Vaccine QUAD 36+ mos IM  Return in about 4 weeks (around 12/28/2017) for BP. Caren Macadam, MD 11/30/2017

## 2017-11-30 NOTE — Progress Notes (Signed)
Subjective:     Carlos Wilcox  Status post laparoscopic appendectomy.  Doing well.  Has no complaints. Objective:    BP (!) 145/91   Pulse 86   Temp 98 F (36.7 C)   Ht 6' 2"  (1.88 m)   Wt 292 lb (132.5 kg)   BMI 37.49 kg/m   General:  alert, cooperative and no distress  Abdomen soft, incisions healing well. Final pathology consistent with diagnosis.     Assessment:    Doing well postoperatively.    Plan:   May return to work without restrictions on 12/06/2017.  Follow-up here as needed.

## 2017-11-30 NOTE — Patient Instructions (Signed)

## 2017-12-05 ENCOUNTER — Ambulatory Visit: Payer: BC Managed Care – PPO | Admitting: General Surgery

## 2018-01-01 ENCOUNTER — Encounter: Payer: Self-pay | Admitting: Family Medicine

## 2018-01-01 ENCOUNTER — Other Ambulatory Visit: Payer: Self-pay

## 2018-01-01 ENCOUNTER — Ambulatory Visit: Payer: BC Managed Care – PPO | Admitting: Family Medicine

## 2018-01-01 VITALS — BP 142/90 | HR 100 | Temp 98.1°F | Resp 16 | Ht 74.0 in | Wt 299.2 lb

## 2018-01-01 DIAGNOSIS — E782 Mixed hyperlipidemia: Secondary | ICD-10-CM

## 2018-01-01 DIAGNOSIS — Z114 Encounter for screening for human immunodeficiency virus [HIV]: Secondary | ICD-10-CM

## 2018-01-01 DIAGNOSIS — I10 Essential (primary) hypertension: Secondary | ICD-10-CM

## 2018-01-01 DIAGNOSIS — E119 Type 2 diabetes mellitus without complications: Secondary | ICD-10-CM

## 2018-01-01 DIAGNOSIS — Z23 Encounter for immunization: Secondary | ICD-10-CM | POA: Diagnosis not present

## 2018-01-01 HISTORY — DX: Type 2 diabetes mellitus without complications: E11.9

## 2018-01-01 MED ORDER — LOSARTAN POTASSIUM 100 MG PO TABS: 100.0000 mg | ORAL_TABLET | Freq: Every day | ORAL | 3 refills | Status: DC

## 2018-01-01 NOTE — Progress Notes (Signed)
Patient ID: Carlos Wilcox, male    DOB: 1990-12-20, 27 y.o.   MRN: [id number]  Chief Complaint  Patient presents with  . Follow-up    Allergies Patient has no known allergies.  Subjective:   Carlos Wilcox is a 27 y.o. male who presents to [facility] today.  HPI Here to follow up. Has been taking BP medications.  Reports that he feels good.  No headache, chest pain, shortness of breath.  Is not getting any swelling in his legs.  Reports she is doing well since he returned back to work from his appendectomy.  Does not check his blood sugars on a daily basis.  Denies any hypoglycemic episodes.  Is not taking any diabetic meds at this time.  Reports he is unable to take the metformin due to nausea.  Reports his bowel movements are normal.  Energy is good.  Mood is good.  Has not had any side effects on the losartan.  Is taking his cholesterol medication each day.  Denies any myalgias.  Reports he is trying to eat a better diet.  Is cut down on the frozen foods that he used to eat.  Has not lost any weight since his last visit and has gained a couple pounds.  Reports he just joined the [employer] and is going to start working out tomorrow.    History reviewed. No pertinent past medical history.  Past Surgical History:  Procedure Laterality Date  . KNEE SURGERY  03/2016  . LAPAROSCOPIC APPENDECTOMY N/A 11/13/2017   Procedure: APPENDECTOMY LAPAROSCOPIC;  Surgeon: Aviva Signs, MD;  Location: [facility];  Service: General;  Laterality: N/A;    Family History  Problem Relation Age of Onset  . Hypertension Mother   . Hypertension Father      Social History   Socioeconomic History  . Marital status: Single    Spouse name: None  . Number of children: None  . Years of education: None  . Highest education level: None  Social Needs  . Financial resource strain: None  . Food insecurity - worry: None  . Food insecurity - inability: None  . Transportation needs - medical: None  .  Transportation needs - non-medical: None  Occupational History  . Occupation: Designer, industrial/product  Tobacco Use  . Smoking status: Never Smoker  . Smokeless tobacco: Never Used  Substance and Sexual Activity  . Alcohol use: Yes    Comment: social drinker. Less than 1 drink per week  . Drug use: No  . Sexual activity: No  Other Topics Concern  . None  Social History Narrative   Lives with parents. Grew up in [place], [place]. Worked at [employer] for a year. Eats all food groups.     Review of Systems  Constitutional: Negative for fatigue, fever and unexpected weight change.  HENT: Negative for dental problem and trouble swallowing.   Eyes: Negative for visual disturbance.  Respiratory: Negative for cough, chest tightness, shortness of breath and wheezing.   Cardiovascular: Negative for chest pain, palpitations and leg swelling.  Gastrointestinal: Negative for abdominal distention, abdominal pain, diarrhea, nausea and vomiting.  Genitourinary: Negative for frequency and urgency.  Musculoskeletal: Negative for back pain and myalgias.  Skin: Negative for rash.  Neurological: Negative for tremors, speech difficulty, weakness, light-headedness and headaches.  Hematological: Negative for adenopathy. Does not bruise/bleed easily.  Psychiatric/Behavioral: Negative for agitation and dysphoric mood. The patient is not nervous/anxious.      Objective:   BP Marland Kitchen)  142/90 (BP Location: Left Arm, Patient Position: Sitting, Cuff Size: Normal)   Pulse 100   Temp 98.1 F (36.7 C) (Temporal)   Resp 16   Ht 6' 2"  (1.88 m)   Wt 299 lb 4 oz (135.7 kg)   SpO2 97%   BMI 38.42 kg/m   Physical Exam  Constitutional: He is oriented to person, place, and time. He appears well-developed and well-nourished.  HENT:  Head: Normocephalic and atraumatic.  Eyes: EOM are normal. Pupils are equal, round, and reactive to light.  Neck: Normal range of motion. Neck supple. No thyromegaly  present.  Cardiovascular: Normal rate, regular rhythm and normal heart sounds.  Pulmonary/Chest: Effort normal and breath sounds normal.  Musculoskeletal: He exhibits no edema.  Neurological: He is alert and oriented to person, place, and time. No cranial nerve deficit.  Skin: Skin is warm, dry and intact.  Psychiatric: He has a normal mood and affect. His behavior is normal. Thought content normal.  Vitals reviewed.    Assessment and Plan  1. HTN, goal below 130/80 Increase losartan to 100 mg p.o. daily.  Check BMP at follow-up.  Diet, exercise, and weight loss recommended to help aid in blood pressure control.  Patient asked to decrease his salt intake. - losartan (COZAAR) 100 MG tablet; Take 1 tablet (100 mg total) by mouth daily.  Dispense: 90 tablet; Refill: 3  2. Type 2 diabetes mellitus without complication, without long-term current use of insulin ([place]) Patient did not get labs prior to his visit.  He is currently not taking any medications for his diabetic control.  He has been wanting to work on diet, exercise, and weight loss.  We will check a A1c today.  Patient defers foot exam today, reports he has been at work and does not want to take off his shoes.  Referral for eye exam done - Ambulatory referral to Ophthalmology  3. Mixed hyperlipidemia Check FLP and LFTs today.  Patient has reportedly been compliant with his statin use  4. Screening for HIV (human immunodeficiency virus)  - HIV antibody (with reflex)  5. Immunization due  - Tdap vaccine greater than or equal to 7yo IM  Return in about 4 weeks (around 01/29/2018) for 3 weeks. Caren Macadam, MD 01/01/2018

## 2018-01-02 ENCOUNTER — Encounter: Payer: Self-pay | Admitting: Family Medicine

## 2018-01-02 LAB — BASIC METABOLIC PANEL
BUN: 9 mg/dL (ref 7–25)
CO2: 28 mmol/L (ref 20–32)
Calcium: 10 mg/dL (ref 8.6–10.3)
Chloride: 104 mmol/L (ref 98–110)
Creat: 0.84 mg/dL (ref 0.60–1.35)
Glucose, Bld: 171 mg/dL — ABNORMAL HIGH (ref 65–139)
Potassium: 4 mmol/L (ref 3.5–5.3)
Sodium: 139 mmol/L (ref 135–146)

## 2018-01-02 LAB — HEPATIC FUNCTION PANEL
AG Ratio: 1.6 (calc) (ref 1.0–2.5)
ALT: 74 U/L — ABNORMAL HIGH (ref 9–46)
AST: 39 U/L (ref 10–40)
Albumin: 4.2 g/dL (ref 3.6–5.1)
Alkaline phosphatase (APISO): 57 U/L (ref 40–115)
Bilirubin, Direct: 0.2 mg/dL (ref 0.0–0.2)
Globulin: 2.7 g/dL (calc) (ref 1.9–3.7)
Indirect Bilirubin: 0.5 mg/dL (calc) (ref 0.2–1.2)
Total Bilirubin: 0.7 mg/dL (ref 0.2–1.2)
Total Protein: 6.9 g/dL (ref 6.1–8.1)

## 2018-01-02 LAB — LIPID PANEL
Cholesterol: 110 mg/dL (ref <200)
HDL: 30 mg/dL — ABNORMAL LOW (ref >40)
LDL Cholesterol (Calc): 56 mg/dL (calc)
Non-HDL Cholesterol (Calc): 80 mg/dL (calc) (ref <130)
Total CHOL/HDL Ratio: 3.7 (calc) (ref <5.0)
Triglycerides: 162 mg/dL — ABNORMAL HIGH (ref <150)

## 2018-01-02 LAB — HEMOGLOBIN A1C
Hgb A1c MFr Bld: 6.4 % of total Hgb — ABNORMAL HIGH (ref <5.7)
Mean Plasma Glucose: 137 (calc)
eAG (mmol/L): 7.6 (calc)

## 2018-01-02 LAB — MICROALBUMIN / CREATININE URINE RATIO
Creatinine, Urine: 149 mg/dL (ref 20–320)
Microalb Creat Ratio: 3 mcg/mg creat (ref <30)
Microalb, Ur: 0.4 mg/dL

## 2018-01-02 LAB — HIV ANTIBODY (ROUTINE TESTING W REFLEX): HIV 1&2 Ab, 4th Generation: NONREACTIVE

## 2018-02-04 ENCOUNTER — Encounter: Payer: Self-pay | Admitting: Family Medicine

## 2018-02-04 DIAGNOSIS — R0683 Snoring: Secondary | ICD-10-CM

## 2018-02-07 NOTE — Telephone Encounter (Signed)
Please call patient and see if he has a history of having OSA. Have we done a referral. What are his symptoms. Gwen Her. Mannie Stabile, MD

## 2018-02-07 NOTE — Telephone Encounter (Signed)
Called patient regarding message below. No answer, left generic message for patient to return call.

## 2018-02-09 ENCOUNTER — Encounter: Payer: Self-pay | Admitting: Family Medicine

## 2018-02-14 ENCOUNTER — Encounter: Payer: Self-pay | Admitting: Family Medicine

## 2018-02-14 ENCOUNTER — Ambulatory Visit (INDEPENDENT_AMBULATORY_CARE_PROVIDER_SITE_OTHER): Payer: BC Managed Care – PPO | Admitting: Family Medicine

## 2018-02-14 ENCOUNTER — Other Ambulatory Visit: Payer: Self-pay

## 2018-02-14 VITALS — BP 110/70 | HR 89 | Temp 97.9°F | Resp 16 | Ht 74.0 in | Wt 291.2 lb

## 2018-02-14 DIAGNOSIS — B349 Viral infection, unspecified: Secondary | ICD-10-CM | POA: Diagnosis not present

## 2018-02-14 NOTE — Progress Notes (Signed)
    Chief Complaint  Patient presents with  . Emesis    weekend/monday  . Diarrhea   Patient is here for follow-up after a viral gastroenteritis over the weekend.  He states that Friday Saturday and Sunday he had nausea vomiting and diarrhea.  Yesterday he still felt queasy, today he is able to keep down most foods.  No fever or chills.  No blood.  No known exposure to any food borne illness.  He needs a note for work.  This is provided for him. He is congratulated on losing 9 pounds since his last visit.  His blood pressure has come under control nicely.  Patient Active Problem List   Diagnosis Date Noted  . HTN, goal below 130/80 01/01/2018  . Type 2 diabetes mellitus without complication, without long-term current use of insulin ([place]) 01/01/2018  . Acute appendicitis   . Uncontrolled type 2 diabetes mellitus with hyperglycemia ([place]) 08/30/2017  . NAFLD (nonalcoholic fatty liver disease) 08/30/2017  . Morbid obesity ([place]) 08/30/2017    Outpatient Encounter Medications as of 02/14/2018  Medication Sig  . atorvastatin (LIPITOR) 20 MG tablet Take 1 tablet (20 mg total) by mouth daily.  Marland Kitchen losartan (COZAAR) 100 MG tablet Take 1 tablet (100 mg total) by mouth daily.   No facility-administered encounter medications on file as of 02/14/2018.     No Known Allergies  Review of Systems  Constitutional: Negative for activity change, appetite change, chills, fatigue and fever.  HENT: Negative for congestion and dental problem.   Eyes: Negative for redness and visual disturbance.  Respiratory: Negative for cough and shortness of breath.   Cardiovascular: Negative for chest pain and palpitations.  Gastrointestinal: Negative for abdominal pain, diarrhea, nausea and vomiting.       All resolved  Genitourinary: Negative for dysuria and frequency.  Musculoskeletal: Negative for back pain and neck pain.  Neurological: Negative for light-headedness and headaches.    BP 110/70 (BP Location:  Left Arm, Patient Position: Sitting, Cuff Size: Normal)   Pulse 89   Temp 97.9 F (36.6 C) (Temporal)   Resp 16   Ht 6' 2"  (1.88 m)   Wt 291 lb 4 oz (132.1 kg)   SpO2 99%   BMI 37.39 kg/m   Physical Exam  Constitutional: He is oriented to person, place, and time. He appears well-developed and well-nourished.  HENT:  Head: Normocephalic and atraumatic.  Right Ear: External ear normal.  Left Ear: External ear normal.  Mouth/Throat: Oropharynx is clear and moist.  Eyes: Conjunctivae are normal. Pupils are equal, round, and reactive to light.  Neck: Normal range of motion. Neck supple. No thyromegaly present.  Cardiovascular: Normal rate, regular rhythm and normal heart sounds.  Pulmonary/Chest: Effort normal and breath sounds normal. No respiratory distress.  Abdominal: Soft. Bowel sounds are normal. There is no tenderness.  Lymphadenopathy:    He has no cervical adenopathy.  Neurological: He is alert and oriented to person, place, and time.  Gait normal  Skin: Skin is warm and dry.  Psychiatric: He has a normal mood and affect. His behavior is normal. Thought content normal.  Nursing note and vitals reviewed.   ASSESSMENT/PLAN:  1. Viral syndrome Resolved   Patient Instructions  Return as needed Work note    Raylene Everts, MD

## 2018-02-14 NOTE — Patient Instructions (Signed)
Return as needed Work note

## 2018-02-20 ENCOUNTER — Ambulatory Visit: Payer: BC Managed Care – PPO | Attending: Family Medicine | Admitting: Neurology

## 2018-02-20 DIAGNOSIS — R0683 Snoring: Secondary | ICD-10-CM | POA: Diagnosis present

## 2018-02-20 DIAGNOSIS — Z79899 Other long term (current) drug therapy: Secondary | ICD-10-CM | POA: Diagnosis not present

## 2018-02-20 DIAGNOSIS — G4733 Obstructive sleep apnea (adult) (pediatric): Secondary | ICD-10-CM | POA: Insufficient documentation

## 2018-02-22 NOTE — Progress Notes (Signed)
Left message to return call 

## 2018-02-22 NOTE — Progress Notes (Signed)
Please call patient and advise that I would like for him to follow up to discuss sleep study. Advise that this test did reveal severe OSA and auto-pap of 8/14 is recommended. Please place referral for auto-pap and supplies. Gwen Her. Mannie Stabile, MD

## 2018-02-22 NOTE — Procedures (Signed)
   Parshall A. Merlene Laughter, MD     [email or web address]             NOCTURNAL POLYSOMNOGRAPHY   LOCATION: [facility]  Patient Name: Carlos Wilcox, Carlos Wilcox Date: 02/20/2018 Gender: Male D.O.B: 12-21-1990 Age (years): 26 Referring Provider: Caren Macadam Height (inches): 74 Interpreting Physician: Phillips Odor MD, ABSM Weight (lbs): 291 RPSGT: Rosebud Poles BMI: 37 MRN: [id number] Neck Size: 19.50 CLINICAL INFORMATION Sleep Study Type: NPSG     Indication for sleep study: Snoring     Epworth Sleepiness Score: 14     SLEEP STUDY TECHNIQUE As per the AASM Manual for the Scoring of Sleep and Associated Events v2.3 (April 2016) with a hypopnea requiring 4% desaturations.  The channels recorded and monitored were frontal, central and occipital EEG, electrooculogram (EOG), submentalis EMG (chin), nasal and oral airflow, thoracic and abdominal wall motion, anterior tibialis EMG, snore microphone, electrocardiogram, and pulse oximetry.  MEDICATIONS Medications self-administered by patient taken the night of the study : N/A  Current Outpatient Medications:  .  atorvastatin (LIPITOR) 20 MG tablet, Take 1 tablet (20 mg total) by mouth daily., Disp: 90 tablet, Rfl: 3 .  losartan (COZAAR) 100 MG tablet, Take 1 tablet (100 mg total) by mouth daily., Disp: 90 tablet, Rfl: 3     SLEEP ARCHITECTURE The study was initiated at 9:55:51 PM and ended at 4:33:19 AM.  Sleep onset time was 0.8 minutes and the sleep efficiency was 88.2%%. The total sleep time was 350.5 minutes.  Stage REM latency was 153.0 minutes.  The patient spent 8.0%% of the night in stage N1 sleep, 66.9%% in stage N2 sleep, 14.8%% in stage N3 and 10.27% in REM.  Alpha intrusion was absent.  Supine sleep was 85.96%.  RESPIRATORY PARAMETERS The overall apnea/hypopnea index (AHI) was 60.9 per hour. There were 100 total apneas, including 99 obstructive, 0 central and 1 mixed apneas. There were 256  hypopneas and 110 RERAs.  The AHI during Stage REM sleep was 76.7 per hour.  AHI while supine was 59.3 per hour.  The mean oxygen saturation was 92.8%. The minimum SpO2 during sleep was 82.0%.  moderate snoring was noted during this study.  CARDIAC DATA The 2 lead EKG demonstrated sinus rhythm. The mean heart rate was N/A beats per minute. Other EKG findings include: None. LEG MOVEMENT DATA The total PLMS were 0 with a resulting PLMS index of 0.0. Associated arousal with leg movement index was 0.0.  IMPRESSIONS Severe obstructive sleep apnea is documented during this recording. Auto-Pap 8-14 is recommended.  Delano Metz, MD Diplomate, American Board of Sleep Medicine.  ELECTRONICALLY SIGNED ON:  02/22/2018, 10:01 AM [facility] PH: [phone]   FX: [phone] [place]

## 2018-02-23 NOTE — Addendum Note (Signed)
Addended by: Merceda Elks on: 02/23/2018 09:28 AM   Modules accepted: Orders

## 2018-02-23 NOTE — Progress Notes (Signed)
Called patient regarding message below. No answer, left generic message for patient to return call.

## 2018-02-26 NOTE — Progress Notes (Signed)
Letter sent.

## 2018-03-01 ENCOUNTER — Encounter: Payer: Self-pay | Admitting: Family Medicine

## 2018-03-07 ENCOUNTER — Ambulatory Visit: Payer: BC Managed Care – PPO | Admitting: Family Medicine

## 2018-03-07 ENCOUNTER — Other Ambulatory Visit (HOSPITAL_COMMUNITY)
Admission: RE | Admit: 2018-03-07 | Discharge: 2018-03-07 | Disposition: A | Discharge status: Home / Self Care | Payer: BC Managed Care – PPO | Source: Ambulatory Visit | Attending: Family Medicine | Admitting: Family Medicine

## 2018-03-07 ENCOUNTER — Encounter: Payer: Self-pay | Admitting: Family Medicine

## 2018-03-07 VITALS — BP 110/78 | HR 83 | Temp 97.7°F | Resp 16 | Ht 74.0 in | Wt 286.5 lb

## 2018-03-07 DIAGNOSIS — E1169 Type 2 diabetes mellitus with other specified complication: Secondary | ICD-10-CM

## 2018-03-07 DIAGNOSIS — G4733 Obstructive sleep apnea (adult) (pediatric): Secondary | ICD-10-CM | POA: Diagnosis not present

## 2018-03-07 DIAGNOSIS — K760 Fatty (change of) liver, not elsewhere classified: Secondary | ICD-10-CM

## 2018-03-07 DIAGNOSIS — Z202 Contact with and (suspected) exposure to infections with a predominantly sexual mode of transmission: Secondary | ICD-10-CM | POA: Diagnosis present

## 2018-03-07 DIAGNOSIS — E785 Hyperlipidemia, unspecified: Secondary | ICD-10-CM | POA: Diagnosis not present

## 2018-03-07 DIAGNOSIS — Z0184 Encounter for antibody response examination: Secondary | ICD-10-CM | POA: Diagnosis not present

## 2018-03-07 DIAGNOSIS — I10 Essential (primary) hypertension: Secondary | ICD-10-CM

## 2018-03-07 DIAGNOSIS — Z79899 Other long term (current) drug therapy: Secondary | ICD-10-CM

## 2018-03-07 NOTE — Progress Notes (Signed)
Patient ID: Carlos Wilcox, male    DOB: 1991-03-03, 27 y.o.   MRN: [id number]  Chief Complaint  Patient presents with  . Follow-up    Allergies Patient has no known allergies.  Subjective:   Carlos Wilcox is a 27 y.o. male who presents to [facility] today.  HPI Here to follow up. Has an appointment for CPAP supplies/set up for later this week.  Here to talk about the results.  Is also here for follow-up of his blood pressure.  Has been taking the increased dose of losartan.  Denies any side effects.  Denies any feelings of dizziness, palpitations, shortness of breath, or chest pain.  Has not had any swelling in his extremities.  Reports his energy level is better.  Has been trying to make better food choices.  Has cut out eating the frozen meals due to the sodium intake.  Has lost weight since he was last seen.  Reports his mood is good.  Does still report some daytime fatigue.  Is not on any medication for his diabetes at this time.  Needs to get a diabetic foot exam today.  Denies any lesions on his feet.  Is taking his cholesterol medication.  Denies any myalgias.  Is not sure if he has been checked for immunity to the hepatitis B vaccine.  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   Past Medical History:  Diagnosis Date  . Type 2 diabetes mellitus without complication, without long-term current use of insulin ([place]) 01/01/2018    Past Surgical History:  Procedure Laterality Date  . KNEE SURGERY  03/2016  . LAPAROSCOPIC APPENDECTOMY N/A 11/13/2017   Procedure: APPENDECTOMY LAPAROSCOPIC;  Surgeon: Aviva Signs, MD;  Location: [facility];  Service: General;  Laterality: N/A;    Family History  Problem Relation Age of Onset  . Hypertension Mother   . Hypertension Father      Social History   Socioeconomic History  . Marital status: Single    Spouse name: Not on file  . Number of children: Not on file  . Years of education: Not on file  .  Highest education level: Not on file  Occupational History  . Occupation: Designer, industrial/product  Social Needs  . Financial resource strain: Not on file  . Food insecurity:    Worry: Not on file    Inability: Not on file  . Transportation needs:    Medical: Not on file    Non-medical: Not on file  Tobacco Use  . Smoking status: Never Smoker  . Smokeless tobacco: Never Used  Substance and Sexual Activity  . Alcohol use: Yes    Comment: social drinker. Less than 1 drink per week  . Drug use: No  . Sexual activity: Never  Lifestyle  . Physical activity:    Days per week: Not on file    Minutes per session: Not on file  . Stress: Not on file  Relationships  . Social connections:    Talks on phone: Not on file    Gets together: Not on file    Attends religious service: Not on file    Active member of club or organization: Not on file    Attends meetings of clubs or organizations: Not on file    Relationship status: Not on file  Other Topics Concern  . Not on file  Social History Narrative   Lives with parents. Grew up in [place], [place]. 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 Eats all food groups.    Current Outpatient Medications on File Prior to Visit  Medication Sig Dispense Refill  . atorvastatin (LIPITOR) 20 MG tablet Take 1 tablet (20 mg total) by mouth daily. 90 tablet 3  . losartan (COZAAR) 100 MG tablet Take 1 tablet (100 mg total) by mouth daily. 90 tablet 3   No current facility-administered medications on file prior to visit.     Review of Systems  Constitutional: Negative for appetite change, chills, fever and unexpected weight change.  HENT: Negative for trouble swallowing and voice change.   Eyes: Negative for visual disturbance.  Respiratory: Negative for cough, chest tightness, shortness of breath and wheezing.   Cardiovascular: Negative for chest pain, palpitations and leg swelling.  Gastrointestinal: Negative for abdominal pain, diarrhea, nausea  and vomiting.  Genitourinary: Negative for decreased urine volume, dysuria and frequency.  Skin: Negative for rash.  Neurological: Negative for dizziness, tremors, syncope, facial asymmetry, weakness and headaches.  Hematological: Negative for adenopathy. Does not bruise/bleed easily.     Objective:   BP 110/78 (BP Location: Left Arm, Patient Position: Sitting, Cuff Size: Normal)   Pulse 83   Temp 97.7 F (36.5 C) (Temporal)   Resp 16   Ht 6' 2"  (1.88 m)   Wt 286 lb 8 oz (130 kg)   SpO2 97%   BMI 36.78 kg/m   Physical Exam  Constitutional: He is oriented to person, place, and time. He appears well-developed and well-nourished.  HENT:  Head: Normocephalic and atraumatic.  Eyes: Pupils are equal, round, and reactive to light. EOM are normal.  Neck: Normal range of motion. Neck supple. No thyromegaly present.  Cardiovascular: Normal rate, regular rhythm and normal heart sounds.  Pulmonary/Chest: Effort normal and breath sounds normal.  Musculoskeletal: He exhibits no edema.  Neurological: He is alert and oriented to person, place, and time. No cranial nerve deficit.  Skin: Skin is warm, dry and intact. Capillary refill takes less than 2 seconds.  Psychiatric: He has a normal mood and affect. His behavior is normal. Judgment and thought content normal.  Vitals reviewed.   Depression screen [facility] 2/9 11/30/2017 08/22/2017  Decreased Interest 0 0  Down, Depressed, Hopeless 0 0  PHQ - 2 Score 0 0   Diabetic Foot Exam - Simple   Simple Foot Form Visual Inspection No deformities, no ulcerations, no other skin breakdown bilaterally:  Yes Sensation Testing Intact to touch and monofilament testing bilaterally:  Yes Pulse Check Posterior Tibialis and Dorsalis pulse intact bilaterally:  Yes Comments     Assessment and Plan  1. HTN, goal below 130/80 Blood pressure at goal. Lifestyle modifications discussed with patient including a diet emphasizing vegetables, fruits, and whole  grains. Limiting intake of sodium to less than 2,400 mg per day.  Recommendations discussed include consuming low-fat dairy products, poultry, fish, legumes, non-tropical vegetable oils, and nuts; and limiting intake of sweets, sugar-sweetened beverages, and red meat. Discussed following a plan such as the Dietary Approaches to Stop Hypertension (DASH) diet. Patient to read up on this diet.  Did discuss diet again.  Discussed with patient that he has a history of his weight going up and down.  Discussed that I would like for him to continue to try to decrease his weight and increase his aerobic activity.  We discussed that as his weight goes up his blood pressure and his diabetes tend to get out of control.  He voiced understanding.  2. NAFLD (nonalcoholic  fatty liver disease) This disease state was discussed with patient today.  We discussed that nonalcoholic fatty liver disease as a cause of liver failure, death, and disability.  We discussed that dietary modifications and weight loss is the main way to control this disorder.  Patient is committed to continuing to try to lose weight.  3. Morbid obesity ([place]) We discussed his diagnosis of morbid obesity today.  We discussed that a BMI greater than 35 with 2 risk factors gives him this diagnosis.  We will continue to work on weight loss.  4. Type 2 diabetes mellitus with other specified complication, without long-term current use of insulin ([place]) Plan to recheck hemoglobin A1c in 3 months.  Patient was counseled regarding if his weight changes dramatically or if his diet worsens that we will need to check some sugars or if he starts to feel poorly. -Diabetic foot exam performed today. -Vaccinations up-to-date. -Hemoglobin A1c at goal. -Foot care discussed. -Patient was reminded to please get his diabetic eye exam.  The referral was placed in February. 5. Hyperlipidemia associated with type 2 diabetes mellitus ([place]) LDL at goal.  Continue statin as  directed. Hyperlipidemia and the associated risk of ASCVD were discussed today. Primary vs. Secondary prevention of ASCVD were discussed and how it relates to patient morbidity, mortality, and quality of life. Shared decision making with patient including the risks of statins vs.benefits of ASCVD risk reduction discussed.  Risks of stains discussed including myopathy, rhabdomyoloysis, liver problems, increased risk of diabetes discussed. We discussed heart healthy diet, lifestyle modifications, risk factor modifications, and adherence to the recommended treatment plan. We discussed the need to periodically monitor lipid panel and liver function tests while on statin therapy.    6. OSA (obstructive sleep apnea) Patient will keep appointment to get CPAP and equipment.  He will call if he has any concerns or problems with getting his equipment.  He understands a health implications of this disorder and the need for treatment.  7. High risk medication use Will check due to increased dose of losartan at last visit. - Basic metabolic panel  8. Immunity status testing Check for immunity to hepatitis B. - Hepatitis B surface antibody  Plan to recheck labs at next visit.  Patient is asked to return to clinic 1-2 days prior to next visit to get his blood work done.  He will call to get the lab work done prior to his visit.  Office visit was 25 minutes.  Greater than 50% of office visit spent counseling and coordinating care.  He was counseled on diabetes, weight loss, obstructive sleep apnea, hypertension.  We discussed the DASH diet and continued low carbohydrate low processed food diet. Return in about 2 months (around 05/07/2018) for follow up. Caren Macadam, MD 03/08/2018

## 2018-03-07 NOTE — Patient Instructions (Signed)
Exercising to Lose Weight Exercising can help you to lose weight. In order to lose weight through exercise, you need to do vigorous-intensity exercise. You can tell that you are exercising with vigorous intensity if you are breathing very hard and fast and cannot hold a conversation while exercising. Moderate-intensity exercise helps to maintain your current weight. You can tell that you are exercising at a moderate level if you have a higher heart rate and faster breathing, but you are still able to hold a conversation. How often should I exercise? Choose an activity that you enjoy and set realistic goals. Your health care provider can help you to make an activity plan that works for you. Exercise regularly as directed by your health care provider. This may include:  Doing resistance training twice each week, such as: ? Push-ups. ? Sit-ups. ? Lifting weights. ? Using resistance bands.  Doing a given intensity of exercise for a given amount of time. Choose from these options: ? 150 minutes of moderate-intensity exercise every week. ? 75 minutes of vigorous-intensity exercise every week. ? A mix of moderate-intensity and vigorous-intensity exercise every week.  Children, pregnant women, people who are out of shape, people who are overweight, and older adults may need to consult a health care provider for individual recommendations. If you have any sort of medical condition, be sure to consult your health care provider before starting a new exercise program. What are some activities that can help me to lose weight?  Walking at a rate of at least 4.5 miles an hour.  Jogging or running at a rate of 5 miles per hour.  Biking at a rate of at least 10 miles per hour.  Lap swimming.  Roller-skating or in-line skating.  Cross-country skiing.  Vigorous competitive sports, such as football, basketball, and soccer.  Jumping rope.  Aerobic dancing. How can I be more active in my day-to-day  activities?  Use the stairs instead of the elevator.  Take a walk during your lunch break.  If you drive, park your car farther away from work or school.  If you take public transportation, get off one stop early and walk the rest of the way.  Make all of your phone calls while standing up and walking around.  Get up, stretch, and walk around every 30 minutes throughout the day. What guidelines should I follow while exercising?  Do not exercise so much that you hurt yourself, feel dizzy, or get very short of breath.  Consult your health care provider prior to starting a new exercise program.  Wear comfortable clothes and shoes with good support.  Drink plenty of water while you exercise to prevent dehydration or heat stroke. Body water is lost during exercise and must be replaced.  Work out until you breathe faster and your heart beats faster. This information is not intended to replace advice given to you by your health care provider. Make sure you discuss any questions you have with your health care provider. Document Released: 12/17/2010 Document Revised: 04/21/2016 Document Reviewed: 04/17/2014 Elsevier Interactive Patient Education  2018 [employer]vier Inc[employer]  

## 2018-03-07 NOTE — Telephone Encounter (Signed)
Patient sent a mychart requesting STD testing. I have asked him to stop by office and leave a urine specimen (for urine cytology) and then go to the lab for blood work.  Please make sure he just leaves 20-30 cc for test of urine. All labs and cytology ordered. Gwen Her. Mannie Stabile, MD

## 2018-03-08 ENCOUNTER — Encounter: Payer: Self-pay | Admitting: Family Medicine

## 2018-03-08 LAB — BASIC METABOLIC PANEL
BUN: 12 mg/dL (ref 7–25)
CO2: 29 mmol/L (ref 20–32)
Calcium: 9.6 mg/dL (ref 8.6–10.3)
Chloride: 102 mmol/L (ref 98–110)
Creat: 0.84 mg/dL (ref 0.60–1.35)
Glucose, Bld: 111 mg/dL (ref 65–139)
Potassium: 4.2 mmol/L (ref 3.5–5.3)
Sodium: 139 mmol/L (ref 135–146)

## 2018-03-08 LAB — HEPATITIS B SURFACE ANTIBODY, QUANTITATIVE: Hepatitis B-Post: 104 m[IU]/mL (ref >=10)

## 2018-03-09 ENCOUNTER — Ambulatory Visit: Payer: BC Managed Care – PPO

## 2018-03-12 LAB — RPR: RPR Ser Ql: NONREACTIVE

## 2018-03-12 LAB — HEPATITIS C ANTIBODY
Hepatitis C Ab: NONREACTIVE
SIGNAL TO CUT-OFF: 0.02 (ref <1.00)

## 2018-03-12 LAB — URINE CYTOLOGY ANCILLARY ONLY
Chlamydia: NEGATIVE
Neisseria Gonorrhea: NEGATIVE
Trichomonas: NEGATIVE

## 2018-03-12 LAB — HIV ANTIBODY (ROUTINE TESTING W REFLEX): HIV 1&2 Ab, 4th Generation: NONREACTIVE

## 2018-03-13 ENCOUNTER — Encounter: Payer: Self-pay | Admitting: Family Medicine

## 2018-03-14 ENCOUNTER — Ambulatory Visit: Payer: Self-pay | Admitting: Family Medicine

## 2018-03-27 ENCOUNTER — Telehealth: Payer: BC Managed Care – PPO | Admitting: Family

## 2018-03-27 DIAGNOSIS — K591 Functional diarrhea: Secondary | ICD-10-CM

## 2018-03-27 NOTE — Progress Notes (Signed)
We are sorry that you are not feeling well.  Here is how we plan to help!  Based on what you have shared with me it looks like you have Acute Infectious Diarrhea.  Most cases of acute diarrhea are due to infections with virus and bacteria and are self-limited conditions lasting less than 14 days.  For your symptoms you may take Imodium 2 mg tablets that are over the counter at your local pharmacy. Take two tablet now and then one after each loose stool up to 6 a day.  Antibiotics are not needed for most people with diarrhea.   HOME CARE  We recommend changing your diet to help with your symptoms for the next few days.  Drink plenty of fluids that contain water salt and sugar. Sports drinks such as Gatorade may help.   You may try broths, soups, bananas, applesauce, soft breads, mashed potatoes or crackers.   You are considered infectious for as long as the diarrhea continues. Hand washing or use of alcohol based hand sanitizers is recommend.  It is best to stay out of work or school until your symptoms stop.   GET HELP RIGHT AWAY  If you have dark yellow colored urine or do not pass urine frequently you should drink more fluids.    If your symptoms worsen   If you feel like you are going to pass out (faint)  You have a new problem  MAKE SURE YOU   Understand these instructions.  Will watch your condition.  Will get help right away if you are not doing well or get worse.  Your e-visit answers were reviewed by a board certified advanced clinical practitioner to complete your personal care plan.  Depending on the condition, your plan could have included both over the counter or prescription medications.  If there is a problem please reply  once you have received a response from your provider.  Your safety is important to [place].  If you have drug allergies check your prescription carefully.    You can use MyChart to ask questions about today's visit, request a non-urgent call  back, or ask for a work or school excuse for 24 hours related to this e-Visit. If it has been greater than 24 hours you will need to follow up with your provider, or enter a new e-Visit to address those concerns.   You will get an e-mail in the next two days asking about your experience.  I hope that your e-visit has been valuable and will speed your recovery. Thank you for using e-visits.

## 2018-04-18 ENCOUNTER — Telehealth: Payer: Self-pay

## 2018-04-18 ENCOUNTER — Encounter: Payer: Self-pay | Admitting: Family Medicine

## 2018-04-18 NOTE — Telephone Encounter (Signed)
Left message requesting call back.   Per Dr.Hagler patient needs to be more compliant with wearing his CPAP machine. Only wore it 19 out of 30 days. 8 of those days he wore it more than 4 hours and 11 of those days he wore it less than 4 hours.

## 2018-04-26 ENCOUNTER — Telehealth: Payer: BC Managed Care – PPO | Admitting: Physician Assistant

## 2018-04-26 DIAGNOSIS — K529 Noninfective gastroenteritis and colitis, unspecified: Secondary | ICD-10-CM

## 2018-04-26 MED ORDER — ONDANSETRON HCL 4 MG PO TABS: 4.0000 mg | ORAL_TABLET | Freq: Three times a day (TID) | ORAL | 0 refills | Status: DC | PRN

## 2018-04-26 NOTE — Progress Notes (Signed)
We are sorry that you are not feeling well.  Here is how we plan to help!  Based on what you have shared with me it looks like you have Acute Infectious Diarrhea.  Most cases of acute diarrhea are due to infections with virus and bacteria and are self-limited conditions lasting less than 14 days.  For your symptoms you may take Imodium 2 mg tablets that are over the counter at your local pharmacy. Take two tablet now and then one after each loose stool up to 6 a day.  Antibiotics are not needed for most people with diarrhea.  Zofran 4 mg 1 tablet every 8 hours as needed for nausea and vomiting   If you still cannot tolerate fluids with the medication above, you need assessment in the ER as you may require IV fluids.   HOME CARE  We recommend changing your diet to help with your symptoms for the next few days.  Drink plenty of fluids that contain water salt and sugar. Sports drinks such as Gatorade may help.   You may try broths, soups, bananas, applesauce, soft breads, mashed potatoes or crackers.   You are considered infectious for as long as the diarrhea continues. Hand washing or use of alcohol based hand sanitizers is recommend.  It is best to stay out of work or school until your symptoms stop.   GET HELP RIGHT AWAY  If you have dark yellow colored urine or do not pass urine frequently you should drink more fluids.    If your symptoms worsen   If you feel like you are going to pass out (faint)  You have a new problem  MAKE SURE YOU   Understand these instructions.  Will watch your condition.  Will get help right away if you are not doing well or get worse.  Your e-visit answers were reviewed by a board certified advanced clinical practitioner to complete your personal care plan.  Depending on the condition, your plan could have included both over the counter or prescription medications.  If there is a problem please reply  once you have received a response from your  provider.  Your safety is important to [place].  If you have drug allergies check your prescription carefully.    You can use MyChart to ask questions about today's visit, request a non-urgent call back, or ask for a work or school excuse for 24 hours related to this e-Visit. If it has been greater than 24 hours you will need to follow up with your provider, or enter a new e-Visit to address those concerns.   You will get an e-mail in the next two days asking about your experience.  I hope that your e-visit has been valuable and will speed your recovery. Thank you for using e-visits.

## 2018-04-30 ENCOUNTER — Encounter: Payer: Self-pay | Admitting: Family Medicine

## 2018-05-04 ENCOUNTER — Encounter: Payer: Self-pay | Admitting: Family Medicine

## 2018-05-09 ENCOUNTER — Telehealth: Payer: Self-pay | Admitting: Family Medicine

## 2018-05-09 DIAGNOSIS — Z0000 Encounter for general adult medical examination without abnormal findings: Secondary | ICD-10-CM

## 2018-05-09 NOTE — Telephone Encounter (Signed)
FMLA PAPERWORK RECEIVED BY FAX THIS MORNING. COPIED,SLEEVED,AND PLACED IN [facility] OFFICE FOR CHARGE.

## 2018-05-11 ENCOUNTER — Encounter: Payer: Self-pay | Admitting: Family Medicine

## 2018-05-14 ENCOUNTER — Telehealth: Payer: Self-pay | Admitting: Family Medicine

## 2018-05-14 NOTE — Telephone Encounter (Signed)
Patient called upset that he is out of work and we do not have his paperwork filled out.  Dr Mannie Stabile noted on the paperwork we did not have all the records she needs to review to do this form.  The records she received are older than a year and she has not seen him for the problem he is reporting on the out of work form.  I told him this and he continued to be upset.  I suggested he go to urgent care if he was having a problem and can not work.  Dr Mannie Stabile will not be back in the office till tue next week.  He said to cancel the form and he is going to urgent care.

## 2018-05-17 ENCOUNTER — Encounter: Payer: Self-pay | Admitting: Family Medicine

## 2018-12-17 ENCOUNTER — Telehealth: Payer: BC Managed Care – PPO | Admitting: Family

## 2018-12-17 DIAGNOSIS — R112 Nausea with vomiting, unspecified: Secondary | ICD-10-CM

## 2018-12-17 DIAGNOSIS — G43909 Migraine, unspecified, not intractable, without status migrainosus: Secondary | ICD-10-CM

## 2018-12-17 NOTE — Progress Notes (Signed)
Based on what you shared with me it looks like you have a serious condition that should be evaluated in a face to face office visit.  NOTE: If you entered your credit card information for this eVisit, you will not be charged. You may see a "hold" on your card for the $30 but that hold will drop off and you will not have a charge processed.  Since you are having a migraine with nausea and vomiting, it would be best to follow up face to face.  If you are having a true medical emergency please call 911.  If you need an urgent face to face visit, [place] has four urgent care centers for your convenience.  If you need care fast and have a high deductible or no insurance consider:  [email or web address] to reserve your spot online an avoid wait times  [facility] [address] [place], [place] [address] 8 am to 8 pm Monday-Friday 10 am to 4 pm Saturday-Sunday *Across the street from [employer]  [place], [address] 8 am to 5 pm Monday-Friday * In the [facility] on the [facility]   The following sites will take your  insurance:  . [facility] [place] a Provider at this Location  [address] [place], [place] [address] . 10 am to 8 pm Monday-Friday . 12 pm to 8 pm Saturday-Sunday   . [facility] at [place] a Provider at this Location  [place], [place], [place] [address] . 8 am to 8 pm Monday-Friday . 9 am to 6 pm Saturday . 11 am to 6 pm Sunday   . [facility] at [place] Get Driving Directions  [address] [place], [place] [address] . 8 am to 8 pm Monday-Friday . 8 am to 4 pm Saturday-Sunday   Your e-visit answers were reviewed by a board certified advanced clinical practitioner to complete your  personal care plan.  Thank you for using e-Visits.

## 2018-12-18 ENCOUNTER — Telehealth: Payer: BC Managed Care – PPO | Admitting: Physician Assistant

## 2018-12-18 DIAGNOSIS — R197 Diarrhea, unspecified: Secondary | ICD-10-CM

## 2018-12-18 MED ORDER — ONDANSETRON HCL 4 MG PO TABS: 4.0000 mg | ORAL_TABLET | Freq: Three times a day (TID) | ORAL | 0 refills | Status: DC | PRN

## 2018-12-18 NOTE — Progress Notes (Signed)
We are sorry that you are not feeling well.  Here is how we plan to help!  Based on what you have shared with me it looks like you have Acute Infectious Diarrhea.  Most cases of acute diarrhea are due to infections with virus and bacteria and are self-limited conditions lasting less than 14 days.  For your symptoms you may take Imodium 2 mg tablets that are over the counter at your local pharmacy. Take two tablet now and then one after each loose stool up to 6 a day.  Antibiotics are not needed for most people with diarrhea.  Optional: Zofran 4 mg 1 tablet every 8 hours as needed for nausea and vomiting    HOME CARE We recommend changing your diet to help with your symptoms for the next few days. Drink plenty of fluids that contain water salt and sugar. Sports drinks such as Gatorade may help.  You may try broths, soups, bananas, applesauce, soft breads, mashed potatoes or crackers.  You are considered infectious for as long as the diarrhea continues. Hand washing or use of alcohol based hand sanitizers is recommend. It is best to stay out of work or school until your symptoms stop.   GET HELP RIGHT AWAY If you have dark yellow colored urine or do not pass urine frequently you should drink more fluids.   If your symptoms worsen  If you feel like you are going to pass out (faint) You have a new problem  MAKE SURE YOU  Understand these instructions. Will watch your condition. Will get help right away if you are not doing well or get worse.  Your e-visit answers were reviewed by a board certified advanced clinical practitioner to complete your personal care plan.  Depending on the condition, your plan could have included both over the counter or prescription medications.  If there is a problem please reply once you have received a response from your provider.  Your safety is important to [place].  If you have drug allergies check your prescription carefully.    You can use MyChart to  ask questions about today's visit, request a non-urgent call back, or ask for a work or school excuse for 24 hours related to this e-Visit. If it has been greater than 24 hours you will need to follow up with your provider, or enter a new e-Visit to address those concerns.   You will get an e-mail in the next two days asking about your experience.  I hope that your e-visit has been valuable and will speed your recovery. Thank you for using e-visits.   ===View-only below this line===   ----- Message -----    From: Pierce Crane    Sent: 12/18/2018  1:56 PM EST      To: E-Visit Mailing List Subject: E-Visit Submission: Diarrhea  E-Visit Submission: Diarrhea --------------------------------  Question: When did the diarrhea begin? Answer:   More than two days ago but less than a week ago  Question: How many stools have you passed in the last 24 hours? Answer:   Five to eight  Question: Do you have a fever? Answer:   Yes, I have a low fever (less than 101 degrees)  Question: Is there blood in your stool, or is your stool dark red or black? Answer:   None of the above  Question: Does your stool contain pus or mucus? Answer:   No, my stool does not contain pus or mucus  Question: Are you vomiting? Answer:  Yes, I am vomiting occassionally  Question: Are you able to keep down fluids? Answer:   Yes, I can keep down some fluids  Question: Do you have belly pain? Answer:   I have pain in the lower part of my belly  Question: Are you feeling dizzy or like you might pass out? Answer:   No  Question: Are you having trouble walking or lifting yourself due to weakness from this illness? Answer:   Yes  Question: Do any of the following apply to you? Answer:   My urine is normal  Question: Did the diarrhea begin after a specific meal that may have caused the illness? Answer:   Not clearly related to a meal  Question: Have you taken antibiotics recently? Answer:   I have not been  on any antibiotics  Question: Have you been hospitalized in the past 2 months? Answer:   No, I have not been hospitalized recently  Question: Have you taken a laxative or a medicine to help you move your bowels lately? 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 Answer:   Yes  Question: Have you had a meal consisting of raw meat or fish in the week prior to your illness? Answer:   No  Question: Have you recently travelled to a place where you may have caught an illness? Answer:   No  Question: Have you tried any medication or other treatment for your symptoms? Answer:   No  Question: Please list your medication allergies that you may have ? (If 'none' , please list as 'none') Answer:   None  Question: Please list any additional comments  Answer:   This started Friday morning at 4:30am. It seemed to clear up Sunday, then on Monday I've had diarrhea and nausea.

## 2019-05-07 ENCOUNTER — Telehealth: Payer: BC Managed Care – PPO | Admitting: Physician Assistant

## 2019-05-07 DIAGNOSIS — R197 Diarrhea, unspecified: Secondary | ICD-10-CM | POA: Diagnosis not present

## 2019-05-07 NOTE — Progress Notes (Signed)
We are sorry that you are not feeling well.  Here is how we plan to help!  Based on what you have shared with me it looks like you have Acute Infectious Diarrhea.  Most cases of acute diarrhea are due to infections with virus and bacteria and are self-limited conditions lasting less than 14 days.  For your symptoms you may take Imodium 2 mg tablets that are over the counter at your local pharmacy. Take two tablet now and then one after each loose stool up to 6 a day.  Antibiotics are not needed for most people with diarrhea.    HOME CARE We recommend changing your diet to help with your symptoms for the next few days. Drink plenty of fluids that contain water salt and sugar. Sports drinks such as Gatorade may help.  You may try broths, soups, bananas, applesauce, soft breads, mashed potatoes or crackers.  You are considered infectious for as long as the diarrhea continues. Hand washing or use of alcohol based hand sanitizers is recommend. It is best to stay out of work or school until your symptoms stop.   GET HELP RIGHT AWAY If you have dark yellow colored urine or do not pass urine frequently you should drink more fluids.   If your symptoms worsen  If you feel like you are going to pass out (faint) You have a new problem  MAKE SURE YOU  Understand these instructions. Will watch your condition. Will get help right away if you are not doing well or get worse.  Your e-visit answers were reviewed by a board certified advanced clinical practitioner to complete your personal care plan.  Depending on the condition, your plan could have included both over the counter or prescription medications.  If there is a problem please reply once you have received a response from your provider.  Your safety is important to [place].  If you have drug allergies check your prescription carefully.    You can use MyChart to ask questions about today's visit, request a non-urgent call back, or ask for a work  or school excuse for 24 hours related to this e-Visit. If it has been greater than 24 hours you will need to follow up with your provider, or enter a new e-Visit to address those concerns.   You will get an e-mail in the next two days asking about your experience.  I hope that your e-visit has been valuable and will speed your recovery. Thank you for using e-visits.   ===View-only below this line===   ----- Message -----    From: Pierce Crane    Sent: 05/07/2019 11:12 AM EDT      To: E-Visit Mailing List Subject: E-Visit Submission: Diarrhea  E-Visit Submission: Diarrhea --------------------------------  Question: When did the diarrhea begin? Answer:   More than two days ago but less than a week ago  Question: How many stools have you passed in the last 24 hours? Answer:   Five to eight  Question: Do you have a fever? Answer:   No, I do not have a fever  Question: Is there blood in your stool, or is your stool dark red or black? Answer:   None of the above  Question: Does your stool contain pus or mucus? Answer:   No, my stool does not contain pus or mucus  Question: Are you vomiting? Answer:   Yes, I am vomiting occassionally  Question: Are you able to keep down fluids? Answer:   Yes, I  can keep down some fluids  Question: Do you have belly pain? Answer:   I have little or no pain  Question: Are you feeling dizzy or like you might pass out? Answer:   No  Question: Are you having trouble walking or lifting yourself due to weakness from this illness? Answer:   No  Question: Do any of the following apply to you? Answer:   My urine is normal  Question: Did the diarrhea begin after a specific meal that may have caused the illness? Answer:   Not clearly related to a meal  Question: Have you taken antibiotics recently? Answer:   I have not been on any antibiotics  Question: Have you been hospitalized in the past 2 months? Answer:   No, I have not been hospitalized  recently  Question: Have you taken a laxative or a medicine to help you move your bowels lately? 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 Answer:   No  Question: Are there people you know with similar symptoms? Answer:   No  Question: Have you had a meal consisting of raw meat or fish in the week prior to your illness? Answer:   No  Question: Have you recently travelled to a place where you may have caught an illness? Answer:   No  Question: Have you tried any medication or other treatment for your symptoms? Answer:   No  Question: Please list your medication allergies that you may have ? (If 'none' , please list as 'none') Answer:   None  Question: Please list any additional comments  Answer:   Friday morning I had diarrhea and some vomiting. Was nauseous all weekend with a headache and occasional diarrhea.                         I had to miss work. I'll need a note excusing my absence.             Friday 5th 2020 to Sunday 8th 2020  A total of 5-10 minutes was spent evaluating this patients questionnaire and formulating a plan of care.

## 2019-05-11 ENCOUNTER — Telehealth: Payer: BC Managed Care – PPO | Admitting: Physician Assistant

## 2019-05-11 DIAGNOSIS — Z20822 Contact with and (suspected) exposure to covid-19: Secondary | ICD-10-CM

## 2019-05-11 NOTE — Progress Notes (Signed)
I have spent 5 minutes in review of e-visit questionnaire, review and updating patient chart, medical decision making and response to patient.   Stephani Janak Cody Lyniah Fujita, PA-C    

## 2019-05-11 NOTE — Progress Notes (Signed)
E-Visit for Corona Virus Screening   Your current symptoms could be consistent with the coronavirus.  Call your health care provider or local health department to request and arrange formal testing. Many health care providers can now test patients at their office but not all are.  Please quarantine yourself while awaiting your test results.  [place] [phone], [place], [place] [phone] or visit [email or web address]   I can provide a work note for you but you will need to utilize the information above to be scheduled for COVID testing.    COVID-19 is a respiratory illness with symptoms that are similar to the flu. Symptoms are typically mild to moderate, but there have been cases of severe illness and death due to the virus. The following symptoms may appear 2-14 days after exposure: . Fever . Cough . Shortness of breath or difficulty breathing . Chills . Repeated shaking with chills . Muscle pain . Headache . Sore throat . New loss of taste or smell . Fatigue . Congestion or runny nose . Nausea or vomiting . Diarrhea  It is vitally important that if you feel that you have an infection such as this virus or any other virus that you stay home and away from places where you may spread it to others.  You should self-quarantine for 14 days if you have symptoms that could potentially be coronavirus or have been in close contact a with a person diagnosed with COVID-19 within the last 2 weeks. You should avoid contact with people age 26 and older.   You should wear a mask or cloth face covering over your nose and mouth if you must be around other people or animals, including pets (even at home). Try to stay at least 6 feet away from other people. This will protect the people around you.  You can use medication such as You may also take  acetaminophen (Tylenol) as needed for fever.   Reduce your risk of any infection by using the same precautions used for avoiding the common cold or flu:  Marland Kitchen Wash your hands often with soap and warm water for at least 20 seconds.  If soap and water are not readily available, use an alcohol-based hand sanitizer with at least 60% alcohol.  . If coughing or sneezing, cover your mouth and nose by coughing or sneezing into the elbow areas of your shirt or coat, into a tissue or into your sleeve (not your hands). . Avoid shaking hands with others and consider head nods or verbal greetings only. . Avoid touching your eyes, nose, or mouth with unwashed hands.  . Avoid close contact with people who are sick. . Avoid places or events with large numbers of people in one location, like concerts or sporting events. . Carefully consider travel plans you have or are making. . If you are planning any travel outside or inside the [place], visit the CDC's Travelers' Health webpage for the latest health notices. . If you have some symptoms but not all symptoms, continue to monitor at home and seek medical attention if your symptoms worsen. . If you are having a medical emergency, call 911.  HOME CARE . Only take medications as instructed by your medical team. . Drink plenty of fluids and get plenty of rest. . A steam or ultrasonic humidifier can help if you have congestion.   GET HELP RIGHT AWAY IF YOU HAVE EMERGENCY WARNING SIGNS** FOR COVID-19. If you or someone  is showing any of these signs seek emergency medical care immediately. Call 911 or proceed to your closest emergency facility if: . You develop worsening high fever. . Trouble breathing . Bluish lips or face . Persistent pain or pressure in the chest . New confusion . Inability to wake or stay awake . You cough up blood. . Your symptoms become more severe  **This list is not all possible symptoms. Contact your medical provider for any symptoms that are  sever or concerning to you.   MAKE SURE YOU   Understand these instructions.  Will watch your condition.  Will get help right away if you are not doing well or get worse.  Your e-visit answers were reviewed by a board certified advanced clinical practitioner to complete your personal care plan.  Depending on the condition, your plan could have included both over the counter or prescription medications.  If there is a problem please reply once you have received a response from your provider.  Your safety is important to [place].  If you have drug allergies check your prescription carefully.    You can use MyChart to ask questions about today's visit, request a non-urgent call back, or ask for a work or school excuse for 24 hours related to this e-Visit. If it has been greater than 24 hours you will need to follow up with your provider, or enter a new e-Visit to address those concerns. You will get an e-mail in the next two days asking about your experience.  I hope that your e-visit has been valuable and will speed your recovery. Thank you for using e-visits.

## 2019-06-01 ENCOUNTER — Telehealth: Payer: BC Managed Care – PPO | Admitting: Family

## 2019-06-01 ENCOUNTER — Encounter: Payer: Self-pay | Admitting: Family

## 2019-06-01 DIAGNOSIS — R112 Nausea with vomiting, unspecified: Secondary | ICD-10-CM | POA: Diagnosis not present

## 2019-06-01 MED ORDER — PROMETHAZINE HCL 25 MG PO TABS: 25.0000 mg | ORAL_TABLET | Freq: Three times a day (TID) | ORAL | 0 refills | Status: DC | PRN

## 2019-06-01 NOTE — Progress Notes (Signed)
We are sorry that you are not feeling well. Here is how we plan to help!  Based on what you have shared with me it looks like you have a Virus that is irritating your GI tract.  Vomiting is the forceful emptying of a portion of the stomach's content through the mouth.  Although nausea and vomiting can make you feel miserable, it's important to remember that these are not diseases, but rather symptoms of an underlying illness.  When we treat short term symptoms, we always caution that any symptoms that persist should be fully evaluated in a medical office.  I have prescribed a medication that will help alleviate your symptoms and allow you to stay hydrated:  Promethazine 25 mg take 1 tablet twice daily  HOME CARE:  Drink clear liquids.  This is very important! Dehydration (the lack of fluid) can lead to a serious complication.  Start off with 1 tablespoon every 5 minutes for 8 hours.  You may begin eating bland foods after 8 hours without vomiting.  Start with saltine crackers, white bread, rice, mashed potatoes, applesauce.  After 48 hours on a bland diet, you may resume a normal diet.  Try to go to sleep.  Sleep often empties the stomach and relieves the need to vomit.  GET HELP RIGHT AWAY IF:   Your symptoms do not improve or worsen within 2 days after treatment.  You have a fever for over 3 days.  You cannot keep down fluids after trying the medication.  MAKE SURE YOU:   Understand these instructions.  Will watch your condition.  Will get help right away if you are not doing well or get worse.   Thank you for choosing an e-visit. Your e-visit answers were reviewed by a board certified advanced clinical practitioner to complete your personal care plan. Depending upon the condition, your plan could have included both over the counter or prescription medications. Please review your pharmacy choice. Be sure that the pharmacy you have chosen is open so that you can pick up your  prescription now.  If there is a problem you may message your provider in [place] to have the prescription routed to another pharmacy. Your safety is important to [place]. If you have drug allergies check your prescription carefully.  For the next 24 hours, you can use MyChart to ask questions about today's visit, request a non-urgent call back, or ask for a work or school excuse from your e-visit provider. You will get an e-mail in the next two days asking about your experience. I hope that your e-visit has been valuable and will speed your recovery.  Greater than 5 minutes, yet less than 10 minutes of time have been spent researching, coordinating, and implementing care for this patient today.  Thank you for the details you included in the comment boxes. Those details are very helpful in determining the best course of treatment for you and help [place] to provide the best care.

## 2019-06-25 ENCOUNTER — Encounter: Payer: Self-pay | Admitting: Gastroenterology

## 2019-08-02 ENCOUNTER — Ambulatory Visit: Payer: BC Managed Care – PPO | Admitting: Gastroenterology

## 2019-08-02 ENCOUNTER — Encounter: Payer: Self-pay | Admitting: Gastroenterology

## 2019-08-02 ENCOUNTER — Other Ambulatory Visit: Payer: Self-pay

## 2019-08-02 VITALS — BP 120/80 | HR 64 | Temp 96.6°F | Ht 74.0 in | Wt 295.0 lb

## 2019-08-02 DIAGNOSIS — R1011 Right upper quadrant pain: Secondary | ICD-10-CM

## 2019-08-02 DIAGNOSIS — R112 Nausea with vomiting, unspecified: Secondary | ICD-10-CM

## 2019-08-02 DIAGNOSIS — Z8371 Family history of colonic polyps: Secondary | ICD-10-CM | POA: Diagnosis not present

## 2019-08-02 DIAGNOSIS — R197 Diarrhea, unspecified: Secondary | ICD-10-CM | POA: Diagnosis not present

## 2019-08-02 DIAGNOSIS — Z83719 Family history of colon polyps, unspecified: Secondary | ICD-10-CM | POA: Insufficient documentation

## 2019-08-02 MED ORDER — DICYCLOMINE HCL 10 MG PO CAPS: 10.0000 mg | ORAL_CAPSULE | Freq: Three times a day (TID) | ORAL | 1 refills | Status: DC

## 2019-08-02 NOTE — Assessment & Plan Note (Signed)
Five month history of persistent N/V/D. Worse with fried foods. Some mild upper abd tenderness on exam. No weight loss. He has DM, diet controlled, no recent A1C available. Gallbladder remains in situ, will check u/s. Screen for celiac. Cannot rule out underlying IBS, IBD. Obtain labs. Trial of bentyl. Further recommendations to follow.   Will f/u abnormal LFTs. Likely due to fatty liver.

## 2019-08-02 NOTE — Progress Notes (Addendum)
Primary Care Physician:  Caren Macadam, MD  Primary Gastroenterologist: Barney Drain, MD  REVIEWED. PT NEEDS TO AVOID FATTY FOODS AND REFLUX TRIGGERS. INCREASE OMEPRAZOLE TO BID. COMPLETE EGD W/ MAC. CONTINUE BENTYL QACHS. SYMPTOMS ARE LIKELY DUE TO GERD AND FOOD INTOLERANCE.   Chief Complaint  Patient presents with  . Nausea    started 03/2019. takes Zofran, Phenergan  . Emesis  . Diarrhea    HPI:  Carlos Wilcox is a 28 y.o. male here at the request of Dr. Mannie Stabile for further evaluation of N/V/D. His mother and father Carlos Wilcox and Carlos Wilcox) both see Dr. Oneida Alar. Mother had adenomatous colon polyp at age of 42.   Presents with symptoms that started around April. At first symptoms sporadic. Usually worse in the mornings. May wake up and eat breakfast, shortly after will have vomiting. Makes it hard to work. Over the last few months, symptoms are more frequent. He has been taking Zofran on days he goes to work and phenergan when he is off. Seems to help. Currently vomiting several days per week. He has had no solid stool since April. BM 4-5 times per day. No melena, brbpr. No significant abdominal pain. Omeprazole controls heartburn. Fried foods makes his vomiting worse but seems to be a delayed reaction ie if he eats fried foods in evening, he will usually have vomiting the following morning.   Symptoms are making work hard. He works in the prison. Can take 15-20 minutes to be relieved to go to restroom.   Current Outpatient Medications  Medication Sig Dispense Refill  . atorvastatin (LIPITOR) 20 MG tablet Take 1 tablet (20 mg total) by mouth daily. 90 tablet 3  . losartan (COZAAR) 100 MG tablet Take 1 tablet (100 mg total) by mouth daily. 90 tablet 3  . omeprazole (PRILOSEC) 20 MG capsule TAKE 1 CAPSULE BY MOUTH ONCE DAILY 30 MINUTES BEFORE MORNING MEAL    . ondansetron (ZOFRAN) 4 MG tablet Take 1 tablet (4 mg total) by mouth every 8 (eight) hours as needed for nausea or vomiting. 20 tablet 0  .  promethazine (PHENERGAN) 25 MG tablet Take 1 tablet (25 mg total) by mouth every 8 (eight) hours as needed for nausea or vomiting. 20 tablet 0  . sertraline (ZOLOFT) 100 MG tablet Take 100 mg by mouth daily.     No current facility-administered medications for this visit.     Allergies as of 08/02/2019  . (No Known Allergies)    Past Medical History:  Diagnosis Date  . Fatty liver   . HTN (hypertension)   . Hyperlipidemia   . Obesity   . Sleep apnea   . Type 2 diabetes mellitus without complication, without long-term current use of insulin ([place]) 01/01/2018    Past Surgical History:  Procedure Laterality Date  . KNEE SURGERY  03/2016  . LAPAROSCOPIC APPENDECTOMY N/A 11/13/2017   Procedure: APPENDECTOMY LAPAROSCOPIC;  Surgeon: Aviva Signs, MD;  Location: [facility];  Service: General;  Laterality: N/A;    Family History  Problem Relation Age of Onset  . Hypertension Mother   . Breast cancer Mother   . Colon polyps Mother 17  . Hypertension Father     Social History   Socioeconomic History  . Marital status: Single    Spouse name: Not on file  . Number of children: Not on file  . Years of education: Not on file  . Highest education level: Not on file  Occupational History  . Occupation: Designer, industrial/product  Social Needs  .  Financial resource strain: Not on file  . Food insecurity    Worry: Not on file    Inability: Not on file  . Transportation needs    Medical: Not on file    Non-medical: Not on file  Tobacco Use  . Smoking status: Never Smoker  . Smokeless tobacco: Never Used  Substance and Sexual Activity  . Alcohol use: Yes    Comment: social drinker. Less than 1 drink per week  . Drug use: No  . Sexual activity: Never  Lifestyle  . Physical activity    Days per week: Not on file    Minutes per session: Not on file  . Stress: Not on file  Relationships  . Social Herbalist on phone: Not on file    Gets together: Not on file    Attends  religious service: Not on file    Active member of club or organization: Not on file    Attends meetings of clubs or organizations: Not on file    Relationship status: Not on file  . Intimate partner violence    Fear of current or ex partner: Not on file    Emotionally abused: Not on file    Physically abused: Not on file    Forced sexual activity: Not on file  Other Topics Concern  . Not on file  Social History Narrative   Lives with parents. Grew up in [place], [place]. Worked at [employer] for a year. Eats all food groups.       ROS:  General: Negative for anorexia, weight loss, fever, chills, fatigue, weakness. Eyes: Negative for vision changes.  ENT: Negative for hoarseness, difficulty swallowing , nasal congestion. CV: Negative for chest pain, angina, palpitations, dyspnea on exertion, peripheral edema.  Respiratory: Negative for dyspnea at rest, dyspnea on exertion, cough, sputum, wheezing.  GI: See history of present illness. GU:  Negative for dysuria, hematuria, urinary incontinence, urinary frequency, nocturnal urination.  MS: Negative for joint pain, low back pain.  Derm: Negative for rash or itching.  Neuro: Negative for weakness, abnormal sensation, seizure, frequent headaches, memory loss, confusion.  Psych: Negative for anxiety, depression, suicidal ideation, hallucinations.  Endo: Negative for unusual weight change.  Heme: Negative for bruising or bleeding. Allergy: Negative for rash or hives.    Physical Examination:  BP 120/80   Pulse 64   Temp (!) 96.6 F (35.9 C) (Oral)   Ht _0  (1.88 m)   Wt 295 lb (133.8 kg)   BMI 37.88 kg/m    General: Well-nourished, well-developed in no acute distress.  Head: Normocephalic, atraumatic.   Eyes: Conjunctiva pink, no icterus. Mouth: Oropharyngeal mucosa moist and pink , no lesions erythema or exudate. Neck: Supple without thyromegaly, masses, or lymphadenopathy.  Lungs: Clear to auscultation  bilaterally.  Heart: Regular rate and rhythm, no murmurs rubs or gallops.  Abdomen: Bowel sounds are normal,mild upper abd tenderness, nondistended, no hepatosplenomegaly or masses, no abdominal bruits or    hernia , no rebound or guarding.   Rectal: not performed Extremities: No lower extremity edema. No clubbing or deformities.  Neuro: Alert and oriented x 4 , grossly normal neurologically.  Skin: Warm and dry, no rash or jaundice.   Psych: Alert and cooperative, normal mood and affect.  Labs: Lab Results  Component Value Date   CREATININE 0.84 03/07/2018   BUN 12 03/07/2018   NA 139 03/07/2018   K 4.2 03/07/2018   CL 102 03/07/2018  CO2 29 03/07/2018   Lab Results  Component Value Date   ALT 74 (H) 01/01/2018   AST 39 01/01/2018   ALKPHOS 65 11/13/2017   BILITOT 0.7 01/01/2018   Lab Results  Component Value Date   WBC 12.2 (H) 11/14/2017   HGB 15.5 11/14/2017   HCT 46.8 11/14/2017   MCV 94.0 11/14/2017   PLT 229 11/14/2017   No results found for: LIPASE No results found for: IRON, TIBC, FERRITIN Lab Results  Component Value Date   HEPAIGM Negative 08/22/2017   HEPBIGM Negative 08/22/2017   HEPCAB NON-REACTIVE 03/09/2018   Lab Results  Component Value Date   HGBA1C 6.4 (H) 01/01/2018   Labs from 01/17/2019 white blood cell count 10,900, hemoglobin 17.9, platelets 262,000, glucose 104, creatinine 0.92, albumin 5.1, total bilirubin 0.9, alkaline phosphatase 40, AST 57, ALT 104, TSH 0.81  Imaging Studies: No results found.

## 2019-08-02 NOTE — Assessment & Plan Note (Signed)
Mother with adenomatous colon polyps removed at age 26. Patient should have his first colonoscopy no later than age 53.

## 2019-08-02 NOTE — Patient Instructions (Signed)
Try Bentyl 45m up to four times daily to control diarrhea. Would recommend taking prior to meals especially while at work.   Ultrasound of liver and gallbladder as scheduled.   Please have your labs done.

## 2019-08-06 ENCOUNTER — Telehealth: Payer: Self-pay

## 2019-08-06 NOTE — Telephone Encounter (Signed)
[place] abd RUQ scheduled for 08/12/19 at 8:30am, arrive at 8:15am. NPO after midnight before test. Tried to call pt, no answer, left detailed message on VM and informed pt of appt. Letter mailed.

## 2019-08-09 LAB — COMPREHENSIVE METABOLIC PANEL
AG Ratio: 1.4 (calc) (ref 1.0–2.5)
ALT: 88 U/L — ABNORMAL HIGH (ref 9–46)
AST: 49 U/L — ABNORMAL HIGH (ref 10–40)
Albumin: 4.3 g/dL (ref 3.6–5.1)
Alkaline phosphatase (APISO): 49 U/L (ref 36–130)
BUN: 13 mg/dL (ref 7–25)
CO2: 27 mmol/L (ref 20–32)
Calcium: 9.7 mg/dL (ref 8.6–10.3)
Chloride: 103 mmol/L (ref 98–110)
Creat: 0.83 mg/dL (ref 0.60–1.35)
Globulin: 3 g/dL (calc) (ref 1.9–3.7)
Glucose, Bld: 161 mg/dL — ABNORMAL HIGH (ref 65–99)
Potassium: 4.4 mmol/L (ref 3.5–5.3)
Sodium: 137 mmol/L (ref 135–146)
Total Bilirubin: 0.4 mg/dL (ref 0.2–1.2)
Total Protein: 7.3 g/dL (ref 6.1–8.1)

## 2019-08-09 LAB — TISSUE TRANSGLUTAMINASE, IGA: (tTG) Ab, IgA: 1 U/mL

## 2019-08-09 LAB — CBC WITH DIFFERENTIAL/PLATELET
Absolute Monocytes: 739 cells/uL (ref 200–950)
Basophils Absolute: 144 cells/uL (ref 0–200)
Basophils Relative: 1.5 %
Eosinophils Absolute: 2861 cells/uL — ABNORMAL HIGH (ref 15–500)
Eosinophils Relative: 29.8 %
HCT: 48.4 % (ref 38.5–50.0)
Hemoglobin: 16.4 g/dL (ref 13.2–17.1)
Lymphs Abs: 1440 cells/uL (ref 850–3900)
MCH: 31 pg (ref 27.0–33.0)
MCHC: 33.9 g/dL (ref 32.0–36.0)
MCV: 91.5 fL (ref 80.0–100.0)
MPV: 11.5 fL (ref 7.5–12.5)
Monocytes Relative: 7.7 %
Neutro Abs: 4416 cells/uL (ref 1500–7800)
Neutrophils Relative %: 46 %
Platelets: 217 10*3/uL (ref 140–400)
RBC: 5.29 10*6/uL (ref 4.20–5.80)
RDW: 12.6 % (ref 11.0–15.0)
Total Lymphocyte: 15 %
WBC: 9.6 10*3/uL (ref 3.8–10.8)

## 2019-08-09 LAB — TSH+FREE T4: TSH W/REFLEX TO FT4: 0.55 mIU/L (ref 0.40–4.50)

## 2019-08-09 LAB — LIPASE: Lipase: 20 U/L (ref 7–60)

## 2019-08-09 LAB — IGA: Immunoglobulin A: 208 mg/dL (ref 47–310)

## 2019-08-12 ENCOUNTER — Ambulatory Visit (HOSPITAL_COMMUNITY)
Admission: RE | Admit: 2019-08-12 | Discharge: 2019-08-12 | Disposition: A | Discharge status: Home / Self Care | Payer: BC Managed Care – PPO | Source: Ambulatory Visit | Attending: Gastroenterology | Admitting: Gastroenterology

## 2019-08-12 ENCOUNTER — Other Ambulatory Visit: Payer: Self-pay

## 2019-08-12 DIAGNOSIS — R112 Nausea with vomiting, unspecified: Secondary | ICD-10-CM

## 2019-08-12 DIAGNOSIS — R197 Diarrhea, unspecified: Secondary | ICD-10-CM | POA: Diagnosis present

## 2019-08-12 DIAGNOSIS — R1011 Right upper quadrant pain: Secondary | ICD-10-CM | POA: Insufficient documentation

## 2019-08-19 ENCOUNTER — Telehealth: Payer: Self-pay

## 2019-08-19 NOTE — Telephone Encounter (Signed)
Work note for 08/11/19 and 08/14/19 provided per MyChart message per [facility]. Called and informed pt. He will pick up note. Note placed at front desk.

## 2019-08-20 ENCOUNTER — Other Ambulatory Visit: Payer: Self-pay

## 2019-08-20 DIAGNOSIS — K760 Fatty (change of) liver, not elsewhere classified: Secondary | ICD-10-CM

## 2019-08-20 NOTE — Progress Notes (Signed)
cbc

## 2019-08-22 NOTE — Progress Notes (Signed)
Carlos Wilcox, please see instructions per [facility] addendum.  Can give him omeprazole 75m bid before meals. #60 and 3 refills.  I see where we have not been able to reach by phone and have mailed letter, FYI patient is responding well via Mychart, consider using that modality.

## 2019-08-26 ENCOUNTER — Telehealth: Payer: Self-pay

## 2019-08-26 ENCOUNTER — Other Ambulatory Visit: Payer: Self-pay

## 2019-08-26 DIAGNOSIS — R112 Nausea with vomiting, unspecified: Secondary | ICD-10-CM

## 2019-08-26 DIAGNOSIS — R197 Diarrhea, unspecified: Secondary | ICD-10-CM

## 2019-08-26 DIAGNOSIS — R1011 Right upper quadrant pain: Secondary | ICD-10-CM

## 2019-08-26 NOTE — Telephone Encounter (Signed)
Per [facility] addendum note: REVIEWED. PT NEEDS TO AVOID FATTY FOODS AND REFLUX TRIGGERS. INCREASE OMEPRAZOLE TO BID. COMPLETE EGD W/ MAC. CONTINUE BENTYL QACHS. SYMPTOMS ARE LIKELY DUE TO GERD AND FOOD INTOLERNACE.  DS spoke to pt: PT is aware of the lab results and [place] results and plan. I have called the Omeprazole 20 mg #60 to take one bid with 3 refills from Neil Crouch, [place], to [place] at the pharmacy. I am putting a handout on fatty liver in the mail for pt.  His lab orders are on file for Dec 2020.  He would like to schedule the EGD.  Forwarding to [place] to schedule.   Called pt, EGD w/Propofol w/[facility] scheduled for 09/03/19 at 12:00pm. Orders entered. Called endo scheduler. Pt can have have pre-op phone call 08/30/19. COVID test appt 09/02/19 at 8:50. Pt aware to quarantine at home after test until procedure. Called pt back and informed him of appts. Procedure instructions given on phone. Appt letter and procedure instructions completed for pt to view in MyChart.

## 2019-08-26 NOTE — Progress Notes (Signed)
PT is aware of the lab results and [place] results and plan. I have called the Omeprazole 20 mg #60 to take one bid with 3 refills from Neil Crouch, [place], to [place] at the pharmacy. I am putting a handout on fatty liver in the mail for pt.  His lab orders are on file for Dec 2020.  He would like to schedule the EGD.  Forwarding to [place] to schedule.

## 2019-08-27 ENCOUNTER — Telehealth: Payer: Self-pay | Admitting: Gastroenterology

## 2019-08-27 NOTE — Telephone Encounter (Signed)
Carlos Wilcox is doing FMLA papers now.

## 2019-08-27 NOTE — Telephone Encounter (Signed)
PATIENT DROPPED OFF FMLA PAPERWORK, PUT IN YOUR BOX BY THE DOOR

## 2019-08-27 NOTE — Telephone Encounter (Signed)
PUT IN JULIES BOX

## 2019-08-28 NOTE — Telephone Encounter (Signed)
Working on paperwork

## 2019-08-28 NOTE — Telephone Encounter (Signed)
Carlos Wilcox, I am working on this patients fmla, any restrictions? and how many possible days out of work?

## 2019-08-30 ENCOUNTER — Encounter (HOSPITAL_COMMUNITY)
Admission: RE | Admit: 2019-08-30 | Discharge: 2019-08-30 | Disposition: A | Discharge status: Home / Self Care | Payer: BC Managed Care – PPO | Source: Ambulatory Visit | Attending: Gastroenterology | Admitting: Gastroenterology

## 2019-08-30 ENCOUNTER — Other Ambulatory Visit: Payer: Self-pay

## 2019-08-30 NOTE — Telephone Encounter (Signed)
No specific work restrictions. I would make sure you put on papers the date of OV (08/02/19), out of work dates of 9/13 and 9/16, date of EGD 10/6.   You can put that he may have days of out of work due to symptoms of N/V/D, unpredictable but once weekly or 3-4 times per month anticipated.

## 2019-09-02 ENCOUNTER — Other Ambulatory Visit: Payer: Self-pay

## 2019-09-02 ENCOUNTER — Other Ambulatory Visit (HOSPITAL_COMMUNITY)
Admission: RE | Admit: 2019-09-02 | Discharge: 2019-09-02 | Disposition: A | Discharge status: Home / Self Care | Payer: BC Managed Care – PPO | Source: Ambulatory Visit | Attending: Gastroenterology | Admitting: Gastroenterology

## 2019-09-02 DIAGNOSIS — Z20828 Contact with and (suspected) exposure to other viral communicable diseases: Secondary | ICD-10-CM | POA: Diagnosis not present

## 2019-09-02 DIAGNOSIS — Z01812 Encounter for preprocedural laboratory examination: Secondary | ICD-10-CM | POA: Insufficient documentation

## 2019-09-02 LAB — SARS CORONAVIRUS 2 (TAT 6-24 HRS): SARS Coronavirus 2: NEGATIVE

## 2019-09-02 NOTE — Telephone Encounter (Signed)
Paperwork done and placed on [employer]

## 2019-09-03 ENCOUNTER — Encounter (HOSPITAL_COMMUNITY)
Admission: RE | Disposition: A | Discharge status: Home / Self Care | Payer: Self-pay | Source: Home / Self Care | Attending: Gastroenterology

## 2019-09-03 ENCOUNTER — Other Ambulatory Visit: Payer: Self-pay

## 2019-09-03 ENCOUNTER — Ambulatory Visit (HOSPITAL_COMMUNITY): Payer: BC Managed Care – PPO | Admitting: Anesthesiology

## 2019-09-03 ENCOUNTER — Encounter (HOSPITAL_COMMUNITY): Payer: Self-pay

## 2019-09-03 ENCOUNTER — Ambulatory Visit (HOSPITAL_COMMUNITY)
Admission: RE | Admit: 2019-09-03 | Discharge: 2019-09-03 | Disposition: A | Discharge status: Home / Self Care | Payer: BC Managed Care – PPO | Attending: Gastroenterology | Admitting: Gastroenterology

## 2019-09-03 DIAGNOSIS — Z6835 Body mass index (BMI) 35.0-35.9, adult: Secondary | ICD-10-CM | POA: Diagnosis not present

## 2019-09-03 DIAGNOSIS — K449 Diaphragmatic hernia without obstruction or gangrene: Secondary | ICD-10-CM | POA: Diagnosis not present

## 2019-09-03 DIAGNOSIS — Z79899 Other long term (current) drug therapy: Secondary | ICD-10-CM | POA: Insufficient documentation

## 2019-09-03 DIAGNOSIS — E119 Type 2 diabetes mellitus without complications: Secondary | ICD-10-CM | POA: Diagnosis not present

## 2019-09-03 DIAGNOSIS — K219 Gastro-esophageal reflux disease without esophagitis: Secondary | ICD-10-CM | POA: Diagnosis not present

## 2019-09-03 DIAGNOSIS — R197 Diarrhea, unspecified: Secondary | ICD-10-CM

## 2019-09-03 DIAGNOSIS — I10 Essential (primary) hypertension: Secondary | ICD-10-CM | POA: Insufficient documentation

## 2019-09-03 DIAGNOSIS — Z8371 Family history of colonic polyps: Secondary | ICD-10-CM | POA: Diagnosis not present

## 2019-09-03 DIAGNOSIS — K2970 Gastritis, unspecified, without bleeding: Secondary | ICD-10-CM

## 2019-09-03 DIAGNOSIS — E669 Obesity, unspecified: Secondary | ICD-10-CM | POA: Insufficient documentation

## 2019-09-03 DIAGNOSIS — E785 Hyperlipidemia, unspecified: Secondary | ICD-10-CM | POA: Insufficient documentation

## 2019-09-03 DIAGNOSIS — K2950 Unspecified chronic gastritis without bleeding: Secondary | ICD-10-CM | POA: Diagnosis not present

## 2019-09-03 DIAGNOSIS — G4730 Sleep apnea, unspecified: Secondary | ICD-10-CM | POA: Insufficient documentation

## 2019-09-03 DIAGNOSIS — Z803 Family history of malignant neoplasm of breast: Secondary | ICD-10-CM | POA: Insufficient documentation

## 2019-09-03 DIAGNOSIS — G43909 Migraine, unspecified, not intractable, without status migrainosus: Secondary | ICD-10-CM | POA: Insufficient documentation

## 2019-09-03 DIAGNOSIS — R112 Nausea with vomiting, unspecified: Secondary | ICD-10-CM

## 2019-09-03 DIAGNOSIS — Z8249 Family history of ischemic heart disease and other diseases of the circulatory system: Secondary | ICD-10-CM | POA: Insufficient documentation

## 2019-09-03 DIAGNOSIS — R1011 Right upper quadrant pain: Secondary | ICD-10-CM | POA: Diagnosis present

## 2019-09-03 DIAGNOSIS — K760 Fatty (change of) liver, not elsewhere classified: Secondary | ICD-10-CM | POA: Insufficient documentation

## 2019-09-03 HISTORY — PX: BIOPSY: SHX5522

## 2019-09-03 HISTORY — PX: ESOPHAGOGASTRODUODENOSCOPY (EGD) WITH PROPOFOL: SHX5813

## 2019-09-03 SURGERY — ESOPHAGOGASTRODUODENOSCOPY (EGD) WITH PROPOFOL
Anesthesia: General

## 2019-09-03 MED ORDER — CHLORHEXIDINE GLUCONATE CLOTH 2 % EX PADS: 6.0000 | MEDICATED_PAD | Freq: Once | CUTANEOUS | Status: DC

## 2019-09-03 MED ORDER — OMEPRAZOLE 20 MG PO CPDR: DELAYED_RELEASE_CAPSULE | ORAL | 11 refills | Status: DC

## 2019-09-03 MED ORDER — FENTANYL CITRATE (PF) 100 MCG/2ML IJ SOLN
INTRAMUSCULAR | Status: DC | PRN
  Administered 2019-09-03: 50 ug via INTRAVENOUS

## 2019-09-03 MED ORDER — MIDAZOLAM HCL 2 MG/2ML IJ SOLN
INTRAMUSCULAR | Status: AC
  Filled 2019-09-03: qty 2

## 2019-09-03 MED ORDER — DICYCLOMINE HCL 10 MG PO CAPS: ORAL_CAPSULE | ORAL | 1 refills | Status: DC

## 2019-09-03 MED ORDER — LACTATED RINGERS IV SOLN
INTRAVENOUS | Status: DC | PRN
  Administered 2019-09-03: 11:00:00 via INTRAVENOUS

## 2019-09-03 MED ORDER — MIDAZOLAM HCL 5 MG/5ML IJ SOLN
INTRAMUSCULAR | Status: DC | PRN
  Administered 2019-09-03: 1 mg via INTRAVENOUS

## 2019-09-03 MED ORDER — FENTANYL CITRATE (PF) 100 MCG/2ML IJ SOLN
INTRAMUSCULAR | Status: AC
  Filled 2019-09-03: qty 2

## 2019-09-03 MED ORDER — LIDOCAINE VISCOUS HCL 2 % MT SOLN: 5.0000 mL | Freq: Once | OROMUCOSAL | Status: DC

## 2019-09-03 MED ORDER — LACTATED RINGERS IV SOLN
Freq: Once | INTRAVENOUS | Status: AC
  Administered 2019-09-03: 10:00:00 via INTRAVENOUS

## 2019-09-03 MED ORDER — PROPOFOL 10 MG/ML IV BOLUS
INTRAVENOUS | Status: DC | PRN
  Administered 2019-09-03: 100 mg via INTRAVENOUS
  Administered 2019-09-03: 50 mg via INTRAVENOUS

## 2019-09-03 MED ORDER — LIDOCAINE VISCOUS HCL 2 % MT SOLN
OROMUCOSAL | Status: AC
  Filled 2019-09-03: qty 15

## 2019-09-03 MED ORDER — PROPOFOL 10 MG/ML IV BOLUS
INTRAVENOUS | Status: AC
  Filled 2019-09-03: qty 40

## 2019-09-03 MED ORDER — PROPOFOL 500 MG/50ML IV EMUL
INTRAVENOUS | Status: DC | PRN
  Administered 2019-09-03: 100 ug/kg/min via INTRAVENOUS

## 2019-09-03 MED ORDER — LIDOCAINE VISCOUS HCL 2 % MT SOLN
OROMUCOSAL | Status: DC | PRN
  Administered 2019-09-03: 1 via OROMUCOSAL

## 2019-09-03 MED ORDER — LIDOCAINE HCL (CARDIAC) PF 100 MG/5ML IV SOSY
PREFILLED_SYRINGE | INTRAVENOUS | Status: DC | PRN
  Administered 2019-09-03: 50 mg via INTRAVENOUS

## 2019-09-03 NOTE — Anesthesia Preprocedure Evaluation (Signed)
Anesthesia Evaluation  Patient identified by MRN, date of birth, ID band Patient awake    Reviewed: Allergy & Precautions, NPO status , Patient's Chart, lab work & pertinent test results, reviewed documented beta blocker date and time   History of Anesthesia Complications Negative for: history of anesthetic complications  Airway Mallampati: III  TM Distance: >3 FB Neck ROM: Full    Dental no notable dental hx.    Pulmonary sleep apnea and Continuous Positive Airway Pressure Ventilation ,    Pulmonary exam normal breath sounds clear to auscultation       Cardiovascular Exercise Tolerance: Good hypertension, Normal cardiovascular exam Rhythm:Regular Rate:Normal     Neuro/Psych negative neurological ROS  negative psych ROS   GI/Hepatic Neg liver ROS, GERD  Medicated and Controlled,  Endo/Other  diabetes (diet controlled), Well Controlled  Renal/GU negative Renal ROS     Musculoskeletal   Abdominal   Peds  Hematology   Anesthesia Other Findings   Reproductive/Obstetrics                             Anesthesia Physical Anesthesia Plan  ASA: III  Anesthesia Plan: General   Post-op Pain Management:    Induction: Intravenous  PONV Risk Score and Plan:   Airway Management Planned: Nasal Cannula, Natural Airway and Simple Face Mask  Additional Equipment:   Intra-op Plan:   Post-operative Plan:   Informed Consent: I have reviewed the patients History and Physical, chart, labs and discussed the procedure including the risks, benefits and alternatives for the proposed anesthesia with the patient or authorized representative who has indicated his/her understanding and acceptance.     Dental advisory given  Plan Discussed with: CRNA  Anesthesia Plan Comments:         Anesthesia Quick Evaluation

## 2019-09-03 NOTE — Op Note (Addendum)
[facility] Patient Name: Carlos Wilcox Procedure Date: 09/03/2019 10:20 AM MRN: [id number] Date of Birth: 11/12/1991 Attending MD: Barney Drain MD, MD CSN: [id number] Age: 28 Admit Type: Outpatient Procedure:                Upper GI endoscopy WITH COLD FORCEPS BIOPSY Indications:              Abdominal pain in the right upper quadrant,                            Persistent vomiting of unknown cause, DIARRHEA.                            SYMPTOMS IMPROVED WITH ZOFRAN AND OMEPRAZOLE. Providers:                Barney Drain MD, MD, Janeece Riggers, RN, Nelma Rothman,                            Technician Referring MD:             Caren Macadam Medicines:                Propofol per Anesthesia Complications:            No immediate complications. Estimated Blood Loss:     Estimated blood loss was minimal. Procedure:                Pre-Anesthesia Assessment:                           - Prior to the procedure, a History and Physical                            was performed, and patient medications and                            allergies were reviewed. The patient's tolerance of                            previous anesthesia was also reviewed. The risks                            and benefits of the procedure and the sedation                            options and risks were discussed with the patient.                            All questions were answered, and informed consent                            was obtained. Prior Anticoagulants: The patient has                            taken no previous anticoagulant or antiplatelet  agents. ASA Grade Assessment: II - A patient with                            mild systemic disease. After reviewing the risks                            and benefits, the patient was deemed in                            satisfactory condition to undergo the procedure.                            After obtaining informed consent, the endoscope  was                            passed under direct vision. Throughout the                            procedure, the patient's blood pressure, pulse, and                            oxygen saturations were monitored continuously. The                            [id number]-H190 ([id number]) scope was introduced through the                            mouth, and advanced to the second part of duodenum.                            The upper GI endoscopy was somewhat difficult due                            to the patient's agitation. Successful completion                            of the procedure was aided by increasing the dose                            of sedation medication. The patient tolerated the                            procedure fairly well. Scope In: 10:50:27 AM Scope Out: 11:00:26 AM Total Procedure Duration: 0 hours 9 minutes 59 seconds  Findings:      The examined esophagus was normal.      INITIALLY UNABLE TO PASS SCOPE DIRECTLY INTO THE DISTAL STOMACH. THE       PROXIMAL STOMACH IS ORIENTED IN A RIGHT TO LEFT ORIENTATION. A large       hiatal hernia was present.      Segmental mild inflammation characterized by congestion (edema) and       erythema was found in the gastric body. Biopsies(2:BODY,1:INCISURA,       2:ANTRUM) were taken with a cold forceps for Helicobacter pylori testing.  The duodenal bulb was normal. Biopsies(2) for histology were taken with       a cold forceps for evaluation of celiac disease.      The second portion of the duodenum was normal. Biopsies(4) for histology       were taken with a cold forceps for evaluation of celiac disease. Impression:               - NAUSEA/VOMITING DUE TO Large hiatal hernia/GERD.                           - NO SOURCE FOR DIARRHEA IDENTIFIED Moderate Sedation:      Per Anesthesia Care Recommendation:           - Patient has a contact number available for                            emergencies. The signs and symptoms of potential                             delayed complications were discussed with the                            patient. Return to normal activities tomorrow.                            Written discharge instructions were provided to the                            patient.                           - Low fat diet and lactose free diet.                           - Continue present medications. BENTYL QAC TID.                           - Await pathology results. IF NEGATIVE, CONSIDER                            GES.                           - Return to GI clinic in 5 months. Procedure Code(s):        --- Professional ---                           [phone], Esophagogastroduodenoscopy, flexible,                            transoral; with biopsy, single or multiple Diagnosis Code(s):        --- Professional ---                           K44.9, Diaphragmatic hernia without obstruction or  gangrene                           K29.70, Gastritis, unspecified, without bleeding                           R10.11, Right upper quadrant pain                           O45.99, Cyclical vomiting syndrome unrelated to                            migraine CPT copyright 2019 American Medical Association. All rights reserved. The codes documented in this report are preliminary and upon coder review may  be revised to meet current compliance requirements. Barney Drain, MD Barney Drain MD, MD 09/03/2019 11:31:30 AM This report has been signed electronically. Number of Addenda: 0

## 2019-09-03 NOTE — H&P (Signed)
Primary Care Physician:  Caren Macadam, MD Primary Gastroenterologist:  Dr. Oneida Alar  Pre-Procedure History & Physical: HPI:  Carlos Wilcox is a 28 y.o. male here for NAUSEA/VOMITING.  Past Medical History:  Diagnosis Date  . Fatty liver   . HTN (hypertension)   . Hyperlipidemia   . Obesity   . Sleep apnea   . Type 2 diabetes mellitus without complication, without long-term current use of insulin ([place]) 01/01/2018    Past Surgical History:  Procedure Laterality Date  . KNEE SURGERY  03/2016  . LAPAROSCOPIC APPENDECTOMY N/A 11/13/2017   Procedure: APPENDECTOMY LAPAROSCOPIC;  Surgeon: Aviva Signs, MD;  Location: [facility];  Service: General;  Laterality: N/A;    Prior to Admission medications   Medication Sig Start Date End Date Taking? Authorizing Provider  dicyclomine (BENTYL) 10 MG capsule Take 1 capsule (10 mg total) by mouth 4 (four) times daily -  before meals and at bedtime. As needed for diarrhea. Patient taking differently: Take 10 mg by mouth 4 (four) times daily as needed for spasms (diarrhea).  08/02/19  Yes Mahala Menghini, PA-C  omeprazole (PRILOSEC) 20 MG capsule 20 mg 2 (two) times daily before a meal.  06/17/19  Yes [provider]  ondansetron (ZOFRAN) 8 MG tablet Take 8 mg by mouth every 8 (eight) hours as needed for nausea or vomiting.   Yes [provider]  promethazine (PHENERGAN) 25 MG tablet Take 1 tablet (25 mg total) by mouth every 8 (eight) hours as needed for nausea or vomiting. 06/01/19  Yes Dutch Quint B, FNP  sertraline (ZOLOFT) 100 MG tablet Take 100 mg by mouth daily. 07/04/19  Yes [provider]  atorvastatin (LIPITOR) 20 MG tablet Take 1 tablet (20 mg total) by mouth daily. Patient not taking: Reported on 08/26/2019 11/30/17   Caren Macadam, MD  losartan (COZAAR) 100 MG tablet Take 1 tablet (100 mg total) by mouth daily. Patient not taking: Reported on 08/26/2019 01/01/18   Caren Macadam, MD  ondansetron (ZOFRAN) 4 MG tablet Take 1  tablet (4 mg total) by mouth every 8 (eight) hours as needed for nausea or vomiting. Patient not taking: Reported on 08/26/2019 12/18/18   Tereasa Coop, PA-C    Allergies as of 08/26/2019  . (No Known Allergies)    Family History  Problem Relation Age of Onset  . Hypertension Mother   . Breast cancer Mother   . Colon polyps Mother 25  . Hypertension Father     Social History   Socioeconomic History  . Marital status: Single    Spouse name: Not on file  . Number of children: Not on file  . Years of education: Not on file  . Highest education level: Not on file  Occupational History  . Occupation: Designer, industrial/product  Social Needs  . Financial resource strain: Not on file  . Food insecurity    Worry: Not on file    Inability: Not on file  . Transportation needs    Medical: Not on file    Non-medical: Not on file  Tobacco Use  . Smoking status: Never Smoker  . Smokeless tobacco: Never Used  Substance and Sexual Activity  . Alcohol use: Yes    Comment: social drinker. Less than 1 drink per week  . Drug use: No  . Sexual activity: Never  Lifestyle  . Physical activity    Days per week: Not on file    Minutes per session: Not on file  . Stress:  Not on file  Relationships  . Social Herbalist on phone: Not on file    Gets together: Not on file    Attends religious service: Not on file    Active member of club or organization: Not on file    Attends meetings of clubs or organizations: Not on file    Relationship status: Not on file  . Intimate partner violence    Fear of current or ex partner: Not on file    Emotionally abused: Not on file    Physically abused: Not on file    Forced sexual activity: Not on file  Other Topics Concern  . Not on file  Social History Narrative   Lives with parents. Grew up in [place], [place]. Worked at [employer] for a year. Eats all food groups.     Review of Systems: See HPI, otherwise negative  ROS   Physical Exam: BP 124/83   Pulse 84   Temp (!) 97.4 F (36.3 C) (Oral)   Resp 17   Ht 6' 2"  (1.88 m)   Wt 127 kg   SpO2 94%   BMI 35.95 kg/m  General:   Alert,  pleasant and cooperative in NAD Head:  Normocephalic and atraumatic. Neck:  Supple; Lungs:  Clear throughout to auscultation.    Heart:  Regular rate and rhythm. Abdomen:  Soft, nontender and nondistended. Normal bowel sounds, without guarding, and without rebound.   Neurologic:  Alert and  oriented x4;  grossly normal neurologically.  Impression/Plan:     NAUSEA/VOMITING.  PLAN: EGD/POSSIBLE DILATION TODAY.  DISCUSSED PROCEDURE, BENEFITS, & RISKS: < 1% chance of medication reaction, bleeding, perforation, or ASPIRATION.

## 2019-09-03 NOTE — Telephone Encounter (Signed)
Code entered, copies for scanning made and patient is aware of $29 fee. Forms are in drawer up front.

## 2019-09-03 NOTE — Anesthesia Postprocedure Evaluation (Signed)
Anesthesia Post Note  Patient: Carlos Wilcox  Procedure(s) Performed: ESOPHAGOGASTRODUODENOSCOPY (EGD) WITH PROPOFOL (N/A ) BIOPSY  Patient location during evaluation: PACU Anesthesia Type: General Level of consciousness: awake and alert and oriented Pain management: pain level controlled Vital Signs Assessment: post-procedure vital signs reviewed and stable Respiratory status: spontaneous breathing Cardiovascular status: blood pressure returned to baseline and stable Postop Assessment: no apparent nausea or vomiting Anesthetic complications: no     Last Vitals:  Vitals:   09/03/19 1105 09/03/19 1119  BP: (!) 98/59 (!) 106/56  Pulse: 93 75  Resp: 17 16  Temp: 36.9 C 36.9 C  SpO2: 95% 92%    Last Pain:  Vitals:   09/03/19 1119  TempSrc: Oral  PainSc: 0-No pain                 Jelena Malicoat

## 2019-09-03 NOTE — Transfer of Care (Addendum)
Immediate Anesthesia Transfer of Care Note  Patient: Carlos Wilcox  Procedure(s) Performed: ESOPHAGOGASTRODUODENOSCOPY (EGD) WITH PROPOFOL (N/A ) BIOPSY  Patient Location: PACU  Anesthesia Type:General  Level of Consciousness: awake, alert  and oriented  Airway & Oxygen Therapy: Patient Spontanous Breathing and Patient connected to face mask oxygen  Post-op Assessment: Report given to RN, Post -op Vital signs reviewed and stable and Patient moving all extremities X 4  Post vital signs: Reviewed and stable  Last Vitals:  Vitals Value Taken Time  BP 98/59 09/03/19 1105  Temp 36.9 C 09/03/19 1105  Pulse 75 09/03/19 1109  Resp 14 09/03/19 1109  SpO2 93 % 09/03/19 1109  Vitals shown include unvalidated device data.  Last Pain:  Vitals:   09/03/19 1009  TempSrc: Oral  PainSc:          Complications: No apparent anesthesia complications

## 2019-09-04 ENCOUNTER — Other Ambulatory Visit: Payer: Self-pay

## 2019-09-04 LAB — SURGICAL PATHOLOGY

## 2019-09-05 NOTE — Discharge Instructions (Signed)
YOU HAVE A LARGE HIATAL HERNIA, WHICH CAN CONTRIBUTE TO UNCONTROLLED REFLUX/REGURGITATION, AND NAUSEA/VOMITING. You have mild gastritis. YOUR SMALL BOWEL LOOKED NORMAL. I biopsied your stomach and small bowel.    TO CONTROL HEARTBURN/REFLUX/NAUSEA/VOMITING/DIARRHEA:    1. DRINK WATER TO KEEP YOUR URINE LIGHT YELLOW.    2. CONTINUE YOUR WEIGHT LOSS EFFORTS. YOUR BODY MASS INDEX IS OVER 30 WHICH MEANS YOU ARE OBESE. OBESITY IS ASSOCIATED WITH AN INCREASED RISK FOR UNCONTROLLED REFLUX, CIRRHOSIS, AND ALL CANCERS, INCLUDING ESOPHAGEAL AND COLON CANCER.    3. STRICTLY  AVOID reflux triggers. SEE INFO BELOW.    4. STRICTLY FOLLOW A LOW FAT/DAIRY FREE DIET. MEATS SHOULD BE BAKED, BROILED, OR BOILED. AVOID FRIED FOODS.     5. CONTINUE OMEPRAZOLE.  TAKE 30 MINUTES PRIOR TO YOUR MEALS TWICE DAILY.    6. USE PEPCID OR TAGAMET FOR BREAKTHROUGH HEARTBURN/REFLUX.   TAKE DICYCLOMINE 30 MINUTES PRIOR TO BREAKFAST, LUNCH, and dinner and if needed to control diarrhea. IT MAY CAUSE DROWSINESS, DRY EYES/MOUTH, BLURRY VISION, OR DIFFICULTY URINATING.   YOUR BIOPSY RESULTS WILL BE BACK IN 5 BUSINESS DAYS.  FOLLOW UP IN 4 MOS.  UPPER ENDOSCOPY AFTER CARE Read the instructions outlined below and refer to this sheet in the next week. These discharge instructions provide you with general information on caring for yourself after you leave the hospital. While your treatment has been planned according to the most current medical practices available, unavoidable complications occasionally occur. If you have any problems or questions after discharge, call DR. Kansas Spainhower, [phone].  ACTIVITY  You may resume your regular activity, but move at a slower pace for the next 24 hours.   Take frequent rest periods for the next 24 hours.   Walking will help get rid of the air and reduce the bloated feeling in your belly (abdomen).   No driving for 24 hours (because of the medicine (anesthesia) used during the test).     You may shower.   Do not sign any important legal documents or operate any machinery for 24 hours (because of the anesthesia used during the test).    NUTRITION  Drink plenty of fluids.   You may resume your normal diet as instructed by your doctor.   Begin with a light meal and progress to your normal diet. Heavy or fried foods are harder to digest and may make you feel sick to your stomach (nauseated).   Avoid alcoholic beverages for 24 hours or as instructed.    MEDICATIONS  You may resume your normal medications.   WHAT YOU CAN EXPECT TODAY  Some feelings of bloating in the abdomen.   Passage of more gas than usual.    IF YOU HAD A BIOPSY TAKEN DURING THE UPPER ENDOSCOPY:  Eat a soft diet IF YOU HAVE NAUSEA, BLOATING, ABDOMINAL PAIN, OR VOMITING.    FINDING OUT THE RESULTS OF YOUR TEST Not all test results are available during your visit. DR. Oneida Alar WILL CALL YOU WITHIN 14 DAYS OF YOUR PROCEDUE WITH YOUR RESULTS. Do not assume everything is normal if you have not heard from DR. Lexandra Rettke, CALL HER OFFICE AT [phone].  SEEK IMMEDIATE MEDICAL ATTENTION AND CALL THE OFFICE: [phone] IF:  You have more than a spotting of blood in your stool.   Your belly is swollen (abdominal distention).   You are nauseated or vomiting.   You have a temperature over 101F.   You have abdominal pain or discomfort that is severe or gets worse throughout the day.  Gastritis  Gastritis is an inflammation (the body's way of reacting to injury and/or infection) of the stomach. It is often caused by viral or bacterial (germ) infections. It can also be caused BY ASPIRIN, BC/GOODY POWDER'S, (IBUPROFEN) MOTRIN, OR ALEVE (NAPROXEN), chemicals (including alcohol), SPICY FOODS, and medications. This illness may be associated with generalized malaise (feeling tired, not well), UPPER ABDOMINAL STOMACH cramps, and fever. One common bacterial cause of gastritis is an organism known as H.  Pylori. This can be treated with antibiotics.    Lifestyle and home remedies TO MANAGE REFLUX/NAUSEA  You may eliminate or reduce the frequency of heartburn by making the following lifestyle changes:   Control your weight. Being overweight is a major risk factor for heartburn and GERD. Excess pounds put pressure on your abdomen, pushing up your stomach and causing acid to back up into your esophagus.    Eat smaller meals. 4 TO 6 MEALS A DAY. This reduces pressure on the lower esophageal sphincter, helping to prevent the valve from opening and acid from washing back into your esophagus.    Loosen your belt. Clothes that fit tightly around your waist put pressure on your abdomen and the lower esophageal sphincter.    Eliminate heartburn triggers. Everyone has specific triggers. Common triggers such as fatty or fried foods, spicy food, tomato sauce, carbonated beverages, alcohol, chocolate, mint, garlic, onion, caffeine and nicotine may make heartburn worse.    Avoid stooping or bending. Tying your shoes is OK. Bending over for longer periods to weed your garden isn't, especially soon after eating.    Don't lie down after a meal. Wait at least three to four hours after eating before going to bed, and don't lie down right after eating.    SLEEP WITH ON A WEDGE OR PUT THE HEAD OF YOUR BED ON 6 INCH BLOCKS TO KEEP YOUR HEAD ABOVE YOUR HEART WHILE YOU SLEEP.   Alternative medicine  Several home remedies exist for treating GERD, but they provide only temporary relief. They include drinking baking soda (sodium bicarbonate) added to water or drinking other fluids such as baking soda mixed with cream of tartar and water.   Although these liquids create temporary relief by neutralizing, washing away or buffering acids, eventually they aggravate the situation by adding gas and fluid to your stomach, increasing pressure and causing more acid reflux. Further, adding more sodium to your diet may  increase your blood pressure and add stress to your heart, and excessive bicarbonate ingestion can alter the acid-base balance in your body.

## 2019-09-06 NOTE — Progress Notes (Signed)
Patient scheduled.

## 2019-09-09 ENCOUNTER — Telehealth: Payer: Self-pay | Admitting: Gastroenterology

## 2019-09-09 NOTE — Progress Notes (Signed)
cc'd to pcp and scheduled

## 2019-09-09 NOTE — Telephone Encounter (Signed)
Please let pt know that he should have first colonoscopy at age 28 due to [place] Colon polyps (mother age 67).

## 2019-09-10 ENCOUNTER — Encounter (HOSPITAL_COMMUNITY): Payer: Self-pay | Admitting: Gastroenterology

## 2019-09-10 MED ORDER — ONDANSETRON HCL 4 MG PO TABS: 4.0000 mg | ORAL_TABLET | Freq: Three times a day (TID) | ORAL | 1 refills | Status: DC | PRN

## 2019-09-10 NOTE — Addendum Note (Signed)
Addended by: Mahala Menghini on: 09/10/2019 02:50 PM   Modules accepted: Orders

## 2019-09-10 NOTE — Telephone Encounter (Signed)
RX for zofran.

## 2019-09-10 NOTE — Telephone Encounter (Addendum)
PT is aware he needs first colonoscopy at age 28.   Pt said he would like to have an Rx for Zofran.  He was getting it from his PCP, but now they are deferring to GI.   Magda Paganini, Please advise!

## 2019-09-12 NOTE — Telephone Encounter (Signed)
[facility] message sent to patient making aware [facility] out until Monday

## 2019-09-12 NOTE — Telephone Encounter (Signed)
Left message that Rx was sent in.

## 2019-09-16 ENCOUNTER — Telehealth: Payer: Self-pay | Admitting: Gastroenterology

## 2019-09-16 NOTE — Telephone Encounter (Signed)
See mychart messages.   Patient requesting work excuse for N/V/D on Sept 13, 16, 17, 18 AND October 14, 15.   Please provide work note.   Please find out from patient if the FMLA forms are enough for his employer or do they also require notes every time he is out sick.   PER [employer] PLAN, HE NEEDS GES. DX N/V.

## 2019-09-16 NOTE — Telephone Encounter (Signed)
Work note at front for pt whenever he calls.

## 2019-09-16 NOTE — Telephone Encounter (Signed)
Left Vm on both numbers for a return call.

## 2019-09-17 ENCOUNTER — Telehealth: Payer: Self-pay | Admitting: Gastroenterology

## 2019-09-17 DIAGNOSIS — R112 Nausea with vomiting, unspecified: Secondary | ICD-10-CM

## 2019-09-17 NOTE — Telephone Encounter (Signed)
Pt said his MyChart told him to call to schedule a Empty Stomach Test. Please call [phone]

## 2019-09-17 NOTE — Telephone Encounter (Signed)
Left vm on both numbers again to call.

## 2019-09-17 NOTE — Telephone Encounter (Signed)
PT is aware the work note is ready. He will find out if he needs a note each time and let us know. OK to schedule GES. Forwarding to [place] to schedule.

## 2019-09-17 NOTE — Addendum Note (Signed)
Addended by: Hassan Rowan on: 09/17/2019 02:16 PM   Modules accepted: Orders

## 2019-09-17 NOTE — Telephone Encounter (Signed)
See other phone note sent by [facility].

## 2019-09-18 NOTE — Telephone Encounter (Signed)
No PA needed for GES per [employer].  GES scheduled for 09/20/19 at 8:00am, arrive at 7:45am. NPO after midnight prior to test and no stomach meds. Test can take up to 4 hours. Called and informed pt of appt.

## 2019-09-20 ENCOUNTER — Encounter (HOSPITAL_COMMUNITY): Payer: Self-pay

## 2019-09-20 ENCOUNTER — Other Ambulatory Visit: Payer: Self-pay

## 2019-09-20 ENCOUNTER — Ambulatory Visit (HOSPITAL_COMMUNITY)
Admission: RE | Admit: 2019-09-20 | Discharge: 2019-09-20 | Disposition: A | Discharge status: Home / Self Care | Payer: BC Managed Care – PPO | Source: Ambulatory Visit | Attending: Gastroenterology | Admitting: Gastroenterology

## 2019-09-20 DIAGNOSIS — R112 Nausea with vomiting, unspecified: Secondary | ICD-10-CM | POA: Insufficient documentation

## 2019-09-20 MED ORDER — TECHNETIUM TC 99M SULFUR COLLOID
2.0000 | Freq: Once | INTRAVENOUS | Status: AC | PRN
  Administered 2019-09-20: 2.1 via ORAL

## 2019-09-23 ENCOUNTER — Telehealth: Payer: Self-pay | Admitting: Gastroenterology

## 2019-09-23 NOTE — Telephone Encounter (Signed)
Pt came to the front window to pick up a doctor's note and asked about his results from a test he had done on Friday. I told him that it would be 7-10 business days and the nurse would call him when it's available

## 2019-09-23 NOTE — Telephone Encounter (Signed)
Forwarding to Neil Crouch, PA for results.

## 2019-09-24 NOTE — Telephone Encounter (Signed)
Noted. GES to be reviewed.

## 2019-09-30 ENCOUNTER — Other Ambulatory Visit: Payer: Self-pay

## 2019-09-30 DIAGNOSIS — K760 Fatty (change of) liver, not elsewhere classified: Secondary | ICD-10-CM

## 2019-09-30 NOTE — Progress Notes (Signed)
alpha

## 2019-10-01 ENCOUNTER — Encounter: Payer: Self-pay | Admitting: Gastroenterology

## 2019-10-01 NOTE — Progress Notes (Signed)
Patient scheduled and letter sent

## 2019-10-03 LAB — HEPATIC FUNCTION PANEL
AG Ratio: 1.4 (calc) (ref 1.0–2.5)
ALT: 57 U/L — ABNORMAL HIGH (ref 9–46)
AST: 33 U/L (ref 10–40)
Albumin: 4.3 g/dL (ref 3.6–5.1)
Alkaline phosphatase (APISO): 51 U/L (ref 36–130)
Bilirubin, Direct: 0.1 mg/dL (ref 0.0–0.2)
Globulin: 3.1 g/dL (calc) (ref 1.9–3.7)
Indirect Bilirubin: 0.3 mg/dL (calc) (ref 0.2–1.2)
Total Bilirubin: 0.4 mg/dL (ref 0.2–1.2)
Total Protein: 7.4 g/dL (ref 6.1–8.1)

## 2019-10-03 LAB — CBC WITH DIFFERENTIAL/PLATELET
Absolute Monocytes: 590 cells/uL (ref 200–950)
Basophils Absolute: 139 cells/uL (ref 0–200)
Basophils Relative: 1.7 %
Eosinophils Absolute: 1501 cells/uL — ABNORMAL HIGH (ref 15–500)
Eosinophils Relative: 18.3 %
HCT: 48 % (ref 38.5–50.0)
Hemoglobin: 16.4 g/dL (ref 13.2–17.1)
Lymphs Abs: 1304 cells/uL (ref 850–3900)
MCH: 30.5 pg (ref 27.0–33.0)
MCHC: 34.2 g/dL (ref 32.0–36.0)
MCV: 89.2 fL (ref 80.0–100.0)
MPV: 12.1 fL (ref 7.5–12.5)
Monocytes Relative: 7.2 %
Neutro Abs: 4666 cells/uL (ref 1500–7800)
Neutrophils Relative %: 56.9 %
Platelets: 267 10*3/uL (ref 140–400)
RBC: 5.38 10*6/uL (ref 4.20–5.80)
RDW: 12.5 % (ref 11.0–15.0)
Total Lymphocyte: 15.9 %
WBC: 8.2 10*3/uL (ref 3.8–10.8)

## 2019-10-03 LAB — IRON,TIBC AND FERRITIN PANEL
%SAT: 21 % (ref 20–48)
Ferritin: 195 ng/mL (ref 38–380)
Iron: 61 ug/dL (ref 50–195)
TIBC: 293 ug/dL (ref 250–425)

## 2019-10-03 LAB — ALPHA-GAL PANEL
Beef IgE: <0.1 kU/L (ref <0.35)
Class: 0
Class: 0
Class: 0
Galactose-alpha-1,3-galactose IgE: <0.1 kU/L (ref <0.10)
LAMB/MUTTON IGE: <0.1 kU/L (ref <0.35)
Pork IgE: <0.1 kU/L (ref <0.35)

## 2019-10-07 ENCOUNTER — Other Ambulatory Visit: Payer: Self-pay | Admitting: Gastroenterology

## 2019-10-07 DIAGNOSIS — R197 Diarrhea, unspecified: Secondary | ICD-10-CM

## 2019-10-07 DIAGNOSIS — D7219 Other eosinophilia: Secondary | ICD-10-CM

## 2019-10-09 LAB — PATHOLOGIST SMEAR REVIEW

## 2019-10-11 LAB — OVA AND PARASITE EXAMINATION
CONCENTRATE RESULT:: NONE SEEN
MICRO NUMBER:: 1085047
SPECIMEN QUALITY:: ADEQUATE
TRICHROME RESULT:: NONE SEEN

## 2019-10-11 LAB — GASTROINTESTINAL PATHOGEN PANEL PCR
C. difficile Tox A/B, PCR: NOT DETECTED
Campylobacter, PCR: NOT DETECTED
Cryptosporidium, PCR: NOT DETECTED
E coli (ETEC) LT/ST PCR: NOT DETECTED
E coli (STEC) stx1/stx2, PCR: NOT DETECTED
E coli 0157, PCR: NOT DETECTED
Giardia lamblia, PCR: NOT DETECTED
Norovirus, PCR: NOT DETECTED
Rotavirus A, PCR: NOT DETECTED
Salmonella, PCR: NOT DETECTED
Shigella, PCR: NOT DETECTED

## 2019-10-22 ENCOUNTER — Other Ambulatory Visit: Payer: Self-pay

## 2019-10-22 DIAGNOSIS — K760 Fatty (change of) liver, not elsewhere classified: Secondary | ICD-10-CM

## 2019-10-31 ENCOUNTER — Encounter: Payer: Self-pay | Admitting: Gastroenterology

## 2019-10-31 ENCOUNTER — Ambulatory Visit: Payer: BC Managed Care – PPO | Admitting: Gastroenterology

## 2019-10-31 ENCOUNTER — Other Ambulatory Visit: Payer: Self-pay

## 2019-10-31 DIAGNOSIS — R112 Nausea with vomiting, unspecified: Secondary | ICD-10-CM

## 2019-10-31 NOTE — Assessment & Plan Note (Signed)
SYMPTOMS FAIRLY WELL CONTROLLED AND MOST LIKELY DUE TO GERD.  AVOID REFLUX TRIGGERS.  HANDOUT GIVEN. TO BETTER CONTROL YOUR ACID REFLUX, CONTINUE OMEPRAZOLE.  TAKE 30 MINUTES PRIOR TO YOUR FIRST MEAL.  USE PEPCID IF NEEDED TO CONTROL YOUR REFLUX. USE ZOFRAN TO CONTROL NAUSEA. FOLLOW UP IN 6 MOS.   PLEASE CALL WITH QUESTIONS OR CONCERNS.

## 2019-10-31 NOTE — Progress Notes (Signed)
Subjective:    Patient ID: Carlos Wilcox, male    DOB: 11-26-1991, 26 y.o.   MRN: [id number]  Caren Macadam, MD   HPI DOING FAIRLY WELL. NO PROBLEMS AT WORK. BMs: 2X/DAY(STILL BSC 6 OR 7). NO ACCIDENTS. NO NAUSEA IF HE TAKES HIS MEDS AND EAT RIGHT. [place]. PROMETHAZINE MAKES HIM SLEEPY. WILL TAKE BENTYL IF DIARRHEA GETS WORSE. FOOD  AT WORK DOESN'T BOTHER HIM. ABDOMINAL PAIN(SHARP, CRAMPY) WHEN HE EATS STUFF HE'S NOT SUPPOSE TO. HURTS IN EPIGASTRIUM AND RLQ. HEARTBURN: CONTROLLED.  PT DENIES FEVER, CHILLS, HEMATOCHEZIA, HEMATEMESIS, nausea, vomiting, melena, CHEST PAIN, SHORTNESS OF BREATH, CHANGE IN BOWEL IN HABITS, constipation,  problems with sedation, OR heartburn or indigestion.  Past Medical History:  Diagnosis Date  . Fatty liver   . HTN (hypertension)   . Hyperlipidemia   . Obesity   . Sleep apnea   . Type 2 diabetes mellitus without complication, without long-term current use of insulin ([place]) 01/01/2018   Past Surgical History:  Procedure Laterality Date  . BIOPSY  09/03/2019   Procedure: BIOPSY;  Surgeon: Danie Binder, MD;  Location: [facility];  Service: Endoscopy;;  . ESOPHAGOGASTRODUODENOSCOPY (EGD) WITH PROPOFOL N/A 09/03/2019   Procedure: ESOPHAGOGASTRODUODENOSCOPY (EGD) WITH PROPOFOL;  Surgeon: Danie Binder, MD;  Location: [facility];  Service: Endoscopy;  Laterality: N/A;  12:00pm  . KNEE SURGERY  03/2016  . LAPAROSCOPIC APPENDECTOMY N/A 11/13/2017   Procedure: APPENDECTOMY LAPAROSCOPIC;  Surgeon: Aviva Signs, MD;  Location: [facility];  Service: General;  Laterality: N/A;   No Known Allergies  Current Outpatient Medications  Medication Sig    . omeprazole (PRILOSEC) 20 MG capsule 1 po 30 mins prior to meals twice daily    . ondansetron (ZOFRAN) 4 MG tablet Take 1 tablet (4 mg total) by mouth every 8 (eight) hours as needed for nausea or vomiting. 1X/DAY   . sertraline (ZOLOFT) 100 MG tablet Take 100 mg by mouth daily.    .      .        Review of Systems PER HPI OTHERWISE ALL SYSTEMS ARE NEGATIVE.    Objective:   Physical Exam Constitutional:      General: He is not in acute distress.    Appearance: Normal appearance.  HENT:     Mouth/Throat:     Comments: MASK IN PLACE Eyes:     General: No scleral icterus.    Pupils: Pupils are equal, round, and reactive to light.  Neck:     Musculoskeletal: Normal range of motion.  Cardiovascular:     Rate and Rhythm: Normal rate and regular rhythm.     Pulses: Normal pulses.     Heart sounds: Normal heart sounds.  Pulmonary:     Effort: Pulmonary effort is normal.     Breath sounds: Normal breath sounds.  Abdominal:     General: Bowel sounds are normal.     Palpations: Abdomen is soft.     Tenderness: There is no abdominal tenderness.  Musculoskeletal:     Right lower leg: No edema.     Left lower leg: No edema.     Comments: MULTIPLE TATTOOS ON ARMS  Lymphadenopathy:     Cervical: No cervical adenopathy.  Skin:    General: Skin is warm and dry.  Neurological:     Mental Status: He is alert and oriented to person, place, and time.     Comments: NO  NEW FOCAL DEFICITS  Psychiatric:  Mood and Affect: Mood normal.     Comments: NORMAL AFFECT       Assessment & Plan:

## 2019-10-31 NOTE — Patient Instructions (Addendum)
AVOID REFLUX TRIGGERS. SEE INFO BELOW.   TO BETTER CONTROL YOUR ACID REFLUX, CONTINUE OMEPRAZOLE.  TAKE 30 MINUTES PRIOR TO YOUR FIRST MEAL.   USE PEPCID IF NEEDED TO CONTROL YOUR REFLUX.  USE ZOFRAN TO CONTROL NAUSEA.   FOLLOW UP IN 6 MOS.   PLEASE CALL WITH QUESTIONS OR CONCERNS.   Lifestyle and home remedies TO MANAGE REFLUX/CHEST PAIN  You may eliminate or reduce the frequency of heartburn by making the following lifestyle changes:  . Control your weight. Being overweight is a major risk factor for heartburn and GERD. Excess pounds put pressure on your abdomen, pushing up your stomach and causing acid to back up into your esophagus.   . Eat smaller meals. 4 TO 6 MEALS A DAY. This reduces pressure on the lower esophageal sphincter, helping to prevent the valve from opening and acid from washing back into your esophagus.   D[place]us Jenny your belt. Clothes that fit tightly around your waist put pressure on your abdomen and the lower esophageal sphincter.   . Eliminate heartburn triggers. Everyone has specific triggers. Common triggers such as fatty or fried foods, spicy food, tomato sauce, carbonated beverages, alcohol, chocolate, mint, garlic, onion, caffeine and nicotine may make heartburn worse.   Marland Kitchen Avoid stooping or bending. Tying your shoes is OK. Bending over for longer periods to weed your garden isn't, especially soon after eating.   . Don't lie down after a meal. Wait at least three to four hours after eating before going to bed, and don't lie down right after eating.   Marland Kitchen PUT THE HEAD OF YOUR BED ON 6 INCH BLOCKS.   Alternative medicine . Several home remedies exist for treating GERD, but they provide only temporary relief. They include drinking baking soda (sodium bicarbonate) added to water or drinking other fluids such as baking soda mixed with cream of tartar and water.  . Although these liquids create temporary relief by neutralizing, washing away or buffering acids,  eventually they aggravate the situation by adding gas and fluid to your stomach, increasing pressure and causing more acid reflux. Further, adding more sodium to your diet may increase your blood pressure and add stress to your heart, and excessive bicarbonate ingestion can alter the acid-base balance in your body.

## 2019-11-01 NOTE — Progress Notes (Signed)
cc'ed to pcp °

## 2019-11-01 NOTE — Progress Notes (Signed)
ON RECALL  °

## 2019-12-13 ENCOUNTER — Telehealth: Payer: Self-pay | Admitting: Gastroenterology

## 2019-12-13 NOTE — Telephone Encounter (Signed)
Pt is aware of results and information. He will come by the office to pick up the work note that I have left at the front desk.

## 2019-12-13 NOTE — Telephone Encounter (Signed)
PLEASE CALL PT. HE SHOULD CONTACT us IF THE NAUSEA AND DIARRHEA ARE UNCONTROLLED AFTER 24 HRS NOT 9 DAYS.   HE SHOULD:    1. AVOID DIARY.   2. TAKE DICYCLOMINE 30 MINUTES PRIOR TO MEALS THREE TIMES A DAY AND AT BEDTIME. IT MAY CAUSE DROWSINESS, DRY EYES/MOUTH, BLURRY VISION, OR DIFFICULTY URINATING.   3. USE IMODIUM IF NEEDED TO CONTROL THE DIARRHEA.   4. USE PROMETHAZINE 30 MINS PRIOR TO MEALS TWICE DAILY AND WHEN NEEDED TO CONTROL NAUSEA.   5. SCHEDULE A COLONOSCOPY W/ MAC, [id number].  HE MAY HAVE A WORK EXCUSE FOR JAN 7-15.

## 2019-12-17 ENCOUNTER — Other Ambulatory Visit: Payer: Self-pay | Admitting: Gastroenterology

## 2019-12-18 ENCOUNTER — Other Ambulatory Visit: Payer: Self-pay

## 2019-12-18 MED ORDER — SUPREP BOWEL PREP KIT 17.5-3.13-1.6 GM/177ML PO SOLN: 1.0000 | ORAL | 0 refills | Status: DC

## 2019-12-18 NOTE — Telephone Encounter (Signed)
Called pt, TCS w/Propofol w/SLF scheduled for 03/10/20 at 2:15pm. Rx for prep sent to pharmacy. Orders entered.

## 2019-12-18 NOTE — Telephone Encounter (Signed)
Pre-op and COVID test 03/06/20. Appt letter mailed with procedure instructions.

## 2019-12-23 NOTE — Telephone Encounter (Signed)
Routing to [facility] in [facility]'s abscence

## 2019-12-24 ENCOUNTER — Other Ambulatory Visit: Payer: Self-pay

## 2019-12-24 DIAGNOSIS — K449 Diaphragmatic hernia without obstruction or gangrene: Secondary | ICD-10-CM

## 2019-12-24 NOTE — Telephone Encounter (Signed)
Please send referral to [facility] for consideration of hiatal hernia repair.

## 2019-12-24 NOTE — Telephone Encounter (Signed)
Referral sent to [facility] via [place].

## 2020-01-07 ENCOUNTER — Ambulatory Visit: Payer: BC Managed Care – PPO | Admitting: Gastroenterology

## 2020-01-07 ENCOUNTER — Other Ambulatory Visit: Payer: Self-pay

## 2020-01-07 ENCOUNTER — Encounter: Payer: Self-pay | Admitting: Gastroenterology

## 2020-01-07 VITALS — BP 117/70 | HR 66 | Temp 96.9°F | Ht 74.0 in | Wt 266.0 lb

## 2020-01-07 DIAGNOSIS — R112 Nausea with vomiting, unspecified: Secondary | ICD-10-CM

## 2020-01-07 DIAGNOSIS — R197 Diarrhea, unspecified: Secondary | ICD-10-CM

## 2020-01-07 MED ORDER — HYOSCYAMINE SULFATE 0.125 MG PO TABS: ORAL_TABLET | ORAL | 3 refills | Status: AC

## 2020-01-07 NOTE — Patient Instructions (Signed)
1. Stop Bentyl for now as it doesn't seem to be helping. We may have to go back to later at higher dose as your insurance doesn't allow many options for diarrhea management.  2. Start Levsin 1-2 before meals and at bedtime. No more than 8 per day.  3. You can add Imodium 69m TID if needed on your work days. 4. Add fiber daily, Fiberchoice chew 2 tablets once daily.  5. Colonoscopy as scheduled.

## 2020-01-07 NOTE — Assessment & Plan Note (Signed)
Patient has lost 30 pounds with dietary changes trying to control GERD and vomiting felt to be due to large hiatal hernia. Will continue omeprazole twice a day before meals. Referral to general surgery for consideration of hiatal hernia repair.

## 2020-01-07 NOTE — Assessment & Plan Note (Signed)
Persistent postprandial diarrhea difficult to manage at work due to delay in access to bathroom facilities. No noted improvement on bentyl. viberzi does not appear to be on his formulary. Trial of levsin 1-2 qac/qhs. Imodium 15m tid prn. Add fiberchoice 2 daily. Colonoscopy as planned.   HE HAS HISTORY OF PERIPHERAL ABSOLUTE EOSINOPHILIA. MAY BENEFIT FROM RANDOM COLON BIOPSIES.

## 2020-01-07 NOTE — Progress Notes (Signed)
Primary Care Physician: Caren Macadam, MD  Primary Gastroenterologist:  Barney Drain, MD   Chief Complaint  Patient presents with  . Diarrhea    has hiatal hernia,diarrhea 1 hour after eating    HPI: Carlos Wilcox is a 29 y.o. male here for follow-up.  He has a history of large hiatal hernia, nausea/vomiting, diarrhea.  EGD September 03, 2019 showed large hiatal hernia suspected to be the cause of his nausea and vomiting.  Proximal stomach oriented right to left orientation and distal stomach cannot be evaluated.  Mild erythema noted in the gastric body.  Biopsies showed chronic gastritis consistent with PPI use, no H. pylori.  Bowel biopsies negative for celiac.  Stool studies in November were negative.  Alpha gal negative.  He has had elevated absolute eosinophil count on a couple of occasions, pathology smear review consistent with absolute eosinophilia.  Also has a history of mild transaminitis.  Iron studies, ferritin, HCV antibody all unremarkable.  Hepatitis B surface antibody shows immunity.  Celiac serologies negative.  TSH and free T4 normal.  Weight down 30 pounds since 07/2019. Has cut out carbonated beverages. Eating bland diet only. No dairy, definitely a trigger food. On schedule 03/10/20 for diarrhea. Started on dicyclomine TID and at bedtime and imodium as needed.   Bentyl not helping. [place] 6 or 7. More eats, more he goes. Four to five times per day. Postprandial urgency. Worse with greasy foods so he avoids as much as possible.No melena, brbpr.  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 Has to call out of work frequently due to diarrhea. Was doing well in December, didn't miss a single day of work. Missed several days in January. Still with intermittent vomiting felt to be related to large hiatal hernia. Patient has requested referral for possible surgical repair which was initiated last month but he has not heard of  appointment time.    Current Outpatient Medications  Medication Sig Dispense Refill  . omeprazole (PRILOSEC) 20 MG capsule 1 po 30 mins prior to meals twice daily 60 capsule 11  . ondansetron (ZOFRAN) 4 MG tablet TAKE 1 TABLET BY MOUTH EVERY 8 HOURS AS NEEDED FOR NAUSEA FOR VOMITING 18 tablet 0  . promethazine (PHENERGAN) 25 MG tablet Take 1 tablet (25 mg total) by mouth every 8 (eight) hours as needed for nausea or vomiting. 20 tablet 0  . sertraline (ZOLOFT) 100 MG tablet Take 100 mg by mouth daily.    . Na Sulfate-K Sulfate-Mg Sulf (SUPREP BOWEL PREP KIT) 17.5-3.13-1.6 GM/177ML SOLN Take 1 kit by mouth as directed. (Patient not taking: Reported on 01/07/2020) 354 mL 0   No current facility-administered medications for this visit.    Allergies as of 01/07/2020  . (No Known Allergies)    ROS:  General: Negative for anorexia, weight loss, fever, chills, fatigue, weakness. ENT: Negative for hoarseness, difficulty swallowing , nasal congestion. CV: Negative for chest pain, angina, palpitations, dyspnea on exertion, peripheral edema.  Respiratory: Negative for dyspnea at rest, dyspnea on exertion, cough, sputum, wheezing.  GI: See history of present illness. GU:  Negative for dysuria, hematuria, urinary incontinence, urinary frequency, nocturnal urination.  Endo: Negative for unusual weight change.    Physical Examination:   BP 117/70   Pulse 66   Temp (!) 96.9 F (36.1 C) (Temporal)   Ht _0  (1.88 m)   Wt 266 lb (120.7 kg)   BMI 34.15 kg/m  General: Well-nourished, well-developed in no acute distress.  Eyes: No icterus. Mouth: masked Lungs: Clear to auscultation bilaterally.  Heart: Regular rate and rhythm, no murmurs rubs or gallops.  Abdomen: Bowel sounds are normal, nontender, nondistended, no hepatosplenomegaly or masses, no abdominal bruits or hernia , no rebound or guarding.   Extremities: No lower extremity edema. No clubbing or deformities. Neuro: Alert and  oriented x 4   Skin: Warm and dry, no jaundice.   Psych: Alert and cooperative, normal mood and affect.  Labs:  Lab Results  Component Value Date   CREATININE 0.83 08/08/2019   BUN 13 08/08/2019   NA 137 08/08/2019   K 4.4 08/08/2019   CL 103 08/08/2019   CO2 27 08/08/2019   Lab Results  Component Value Date   ALT 57 (H) 09/30/2019   AST 33 09/30/2019   ALKPHOS 65 11/13/2017   BILITOT 0.4 09/30/2019   Lab Results  Component Value Date   WBC 8.2 09/30/2019   HGB 16.4 09/30/2019   HCT 48.0 09/30/2019   MCV 89.2 09/30/2019   PLT 267 09/30/2019   No results found for: TSH   Imaging Studies: No results found.

## 2020-01-08 NOTE — Progress Notes (Signed)
CC'ED TO PCP 

## 2020-01-10 ENCOUNTER — Telehealth: Payer: Self-pay | Admitting: Gastroenterology

## 2020-01-10 ENCOUNTER — Encounter: Payer: Self-pay | Admitting: Gastroenterology

## 2020-01-10 NOTE — Telephone Encounter (Signed)
REVIEWED. AGREE. NO ADDITIONAL RECOMMENDATIONS. 

## 2020-01-10 NOTE — Telephone Encounter (Addendum)
Also see MyChart message sent today by patient, requesting work note for Jan 11 to Feb 9.   Patient seen in office 01/07/20 and did not mention that he missed an additional four weeks of work since he was provided a work note for Jan 7-15 by Dr. Oneida Alar.   See telephone note from her on 12/13/19 that he had to call within 24 hours if diarrhea not controlled and has to miss work NOT after multiple missed days.   I can give work excuse note for Feb 8-9 only.   Return to work papers passed to [place].

## 2020-01-10 NOTE — Telephone Encounter (Signed)
Patient dropped off papers he needs completed for his return to work. I put them in your office basket.

## 2020-01-13 ENCOUNTER — Telehealth: Payer: Self-pay

## 2020-01-13 NOTE — Telephone Encounter (Signed)
Called pt, TCS scheduled for 03/10/20 moved up to 02/11/20 at 9:45am (pt was on a cancellation list). LMOVM for endo scheduler.

## 2020-01-13 NOTE — Telephone Encounter (Signed)
PT is aware.

## 2020-01-13 NOTE — Telephone Encounter (Signed)
Work note and work papers are at the [employer]. I have tried to call the pt several times and the phone is busy.

## 2020-01-14 NOTE — Telephone Encounter (Signed)
COVID test and pre-op appt 02/10/20. Letter mailed with new procedure instructions.

## 2020-01-15 ENCOUNTER — Encounter: Payer: Self-pay | Admitting: Gastroenterology

## 2020-01-20 ENCOUNTER — Encounter: Payer: Self-pay | Admitting: Gastroenterology

## 2020-01-21 NOTE — Telephone Encounter (Signed)
I spoke with the patient and made him aware we could only do a letter for the days mentioned earlier.

## 2020-02-03 ENCOUNTER — Telehealth: Payer: BC Managed Care – PPO | Admitting: Emergency Medicine

## 2020-02-03 DIAGNOSIS — R112 Nausea with vomiting, unspecified: Secondary | ICD-10-CM

## 2020-02-03 MED ORDER — ONDANSETRON HCL 4 MG PO TABS: 4.0000 mg | ORAL_TABLET | Freq: Three times a day (TID) | ORAL | 0 refills | Status: DC | PRN

## 2020-02-03 NOTE — Progress Notes (Signed)
We are sorry that you are not feeling well. Here is how we plan to help!  Based on what you have shared with me it looks like you have a Virus that is irritating your GI tract.  Vomiting is the forceful emptying of a portion of the stomach's content through the mouth.  Although nausea and vomiting can make you feel miserable, it's important to remember that these are not diseases, but rather symptoms of an underlying illness.  When we treat short term symptoms, we always caution that any symptoms that persist should be fully evaluated in a medical office.  I have prescribed a medication that will help alleviate your symptoms and allow you to stay hydrated:  Zofran 4 mg 1 tablet every 8 hours as needed for nausea and vomiting  HOME CARE:  Drink clear liquids.  This is very important! Dehydration (the lack of fluid) can lead to a serious complication.  Start off with 1 tablespoon every 5 minutes for 8 hours.  You may begin eating bland foods after 8 hours without vomiting.  Start with saltine crackers, white bread, rice, mashed potatoes, applesauce.  After 48 hours on a bland diet, you may resume a normal diet.  Try to go to sleep.  Sleep often empties the stomach and relieves the need to vomit.  GET HELP RIGHT AWAY IF:   Your symptoms do not improve or worsen within 2 days after treatment.  You have a fever for over 3 days.  You cannot keep down fluids after trying the medication.  MAKE SURE YOU:   Understand these instructions.  Will watch your condition.  Will get help right away if you are not doing well or get worse.   Thank you for choosing an e-visit. Your e-visit answers were reviewed by a board certified advanced clinical practitioner to complete your personal care plan. Depending upon the condition, your plan could have included both over the counter or prescription medications. Please review your pharmacy choice. Be sure that the pharmacy you have chosen is open so  that you can pick up your prescription now.  If there is a problem you may message your provider in [place] to have the prescription routed to another pharmacy. Your safety is important to [place]. If you have drug allergies check your prescription carefully.  For the next 24 hours, you can use MyChart to ask questions about today's visit, request a non-urgent call back, or ask for a work or school excuse from your e-visit provider. You will get an e-mail in the next two days asking about your experience. I hope that your e-visit has been valuable and will speed your recovery.  Greater than 5 but less than 10 minutes spent researching, coordinating, and implementing care for this patient today

## 2020-02-04 NOTE — Patient Instructions (Signed)
Carlos Wilcox  02/04/2020     @PREFPERIOPPHARMACY @   Your procedure is scheduled on  02/11/2020 .  Report to [facility] at  [place]  A.M.  Call this number if you have problems the morning of surgery:  [phone]   Remember:  Follow the diet and prep instructions given to you by Dr Nona Dell office.                    Take these medicines the morning of surgery with A SIP OF WATER  Levsin, prilosec, zofran or phenergan(if needed), zoloft. DO NOT take any medications for diabetes the morning of your procedure.    Do not wear jewelry, make-up or nail polish.  Do not wear lotions, powders, or perfumes. Please wear deodorant and brush your teeth.  Do not shave 48 hours prior to surgery.  Men may shave face and neck.  Do not bring valuables to the hospital.  [facility] is not responsible for any belongings or valuables.  Contacts, dentures or bridgework may not be worn into surgery.  Leave your suitcase in the car.  After surgery it may be brought to your room.  For patients admitted to the hospital, discharge time will be determined by your treatment team.  Patients discharged the day of surgery will not be allowed to drive home.   Name and phone number of your driver:   family Special instructions:  DO NOT smoke the morning of your procedure.  Please read over the following fact sheets that you were given. Anesthesia Post-op Instructions and Care and Recovery After Surgery       Upper Endoscopy, Adult, Care After This sheet gives you information about how to care for yourself after your procedure. Your health care provider may also give you more specific instructions. If you have problems or questions, contact your health care provider. What can I expect after the procedure? After the procedure, it is common to have:  A sore throat.  Mild stomach pain or discomfort.  Bloating.  Nausea. Follow these instructions at home:   Follow instructions from your health  care provider about what to eat or drink after your procedure.  Return to your normal activities as told by your health care provider. Ask your health care provider what activities are safe for you.  Take over-the-counter and prescription medicines only as told by your health care provider.  Do not drive for 24 hours if you were given a sedative during your procedure.  Keep all follow-up visits as told by your health care provider. This is important. Contact a health care provider if you have:  A sore throat that lasts longer than one day.  Trouble swallowing. Get help right away if:  You vomit blood or your vomit looks like coffee grounds.  You have: ? A fever. ? Bloody, black, or tarry stools. ? A severe sore throat or you cannot swallow. ? Difficulty breathing. ? Severe pain in your chest or abdomen. Summary  After the procedure, it is common to have a sore throat, mild stomach discomfort, bloating, and nausea.  Do not drive for 24 hours if you were given a sedative during the procedure.  Follow instructions from your health care provider about what to eat or drink after your procedure.  Return to your normal activities as told by your health care provider. This information is not intended to replace advice given to you by your health care  provider. Make sure you discuss any questions you have with your health care provider. Document Revised: 05/08/2018 Document Reviewed: 04/16/2018 Elsevier Patient Education  [place].  Colonoscopy, Adult, Care After This sheet gives you information about how to care for yourself after your procedure. Your health care provider may also give you more specific instructions. If you have problems or questions, contact your health care provider. What can I expect after the procedure? After the procedure, it is common to have:  A small amount of blood in your stool for 24 hours after the procedure.  Some gas.  Mild cramping or  bloating of your abdomen. Follow these instructions at home: Eating and drinking   Drink enough fluid to keep your urine pale yellow.  Follow instructions from your health care provider about eating or drinking restrictions.  Resume your normal diet as instructed by your health care provider. Avoid heavy or fried foods that are hard to digest. Activity  Rest as told by your health care provider.  Avoid sitting for a long time without moving. Get up to take short walks every 1-2 hours. This is important to improve blood flow and breathing. Ask for help if you feel weak or unsteady.  Return to your normal activities as told by your health care provider. Ask your health care provider what activities are safe for you. Managing cramping and bloating   Try walking around when you have cramps or feel bloated.  Apply heat to your abdomen as told by your health care provider. Use the heat source that your health care provider recommends, such as a moist heat pack or a heating pad. ? Place a towel between your skin and the heat source. ? Leave the heat on for 20-30 minutes. ? Remove the heat if your skin turns bright red. This is especially important if you are unable to feel pain, heat, or cold. You may have a greater risk of getting burned. General instructions  For the first 24 hours after the procedure: ? Do not drive or use machinery. ? Do not sign important documents. ? Do not drink alcohol. ? Do your regular daily activities at a slower pace than normal. ? Eat soft foods that are easy to digest.  Take over-the-counter and prescription medicines only as told by your health care provider.  Keep all follow-up visits as told by your health care provider. This is important. Contact a health care provider if:  You have blood in your stool 2-3 days after the procedure. Get help right away if you have:  More than a small spotting of blood in your stool.  Large blood clots in your  stool.  Swelling of your abdomen.  Nausea or vomiting.  A fever.  Increasing pain in your abdomen that is not relieved with medicine. Summary  After the procedure, it is common to have a small amount of blood in your stool. You may also have mild cramping and bloating of your abdomen.  For the first 24 hours after the procedure, do not drive or use machinery, sign important documents, or drink alcohol.  Get help right away if you have a lot of blood in your stool, nausea or vomiting, a fever, or increased pain in your abdomen. This information is not intended to replace advice given to you by your health care provider. Make sure you discuss any questions you have with your health care provider. Document Revised: 06/10/2019 Document Reviewed: 06/10/2019 Elsevier Patient Education  [place].  Monitored Anesthesia Care, Care After These instructions provide you with information about caring for yourself after your procedure. Your health care provider may also give you more specific instructions. Your treatment has been planned according to current medical practices, but problems sometimes occur. Call your health care provider if you have any problems or questions after your procedure. What can I expect after the procedure? After your procedure, you may:  Feel sleepy for several hours.  Feel clumsy and have poor balance for several hours.  Feel forgetful about what happened after the procedure.  Have poor judgment for several hours.  Feel nauseous or vomit.  Have a sore throat if you had a breathing tube during the procedure. Follow these instructions at home: For at least 24 hours after the procedure:      Have a responsible adult stay with you. It is important to have someone help care for you until you are awake and alert.  Rest as needed.  Do not: ? Participate in activities in which you could fall or become injured. ? Drive. ? Use heavy machinery. ? Drink  alcohol. ? Take sleeping pills or medicines that cause drowsiness. ? Make important decisions or sign legal documents. ? Take care of children on your own. Eating and drinking  Follow the diet that is recommended by your health care provider.  If you vomit, drink water, juice, or soup when you can drink without vomiting.  Make sure you have little or no nausea before eating solid foods. General instructions  Take over-the-counter and prescription medicines only as told by your health care provider.  If you have sleep apnea, surgery and certain medicines can increase your risk for breathing problems. Follow instructions from your health care provider about wearing your sleep device: ? Anytime you are sleeping, including during daytime naps. ? While taking prescription pain medicines, sleeping medicines, or medicines that make you drowsy.  If you smoke, do not smoke without supervision.  Keep all follow-up visits as told by your health care provider. This is important. Contact a health care provider if:  You keep feeling nauseous or you keep vomiting.  You feel light-headed.  You develop a rash.  You have a fever. Get help right away if:  You have trouble breathing. Summary  For several hours after your procedure, you may feel sleepy and have poor judgment.  Have a responsible adult stay with you for at least 24 hours or until you are awake and alert. This information is not intended to replace advice given to you by your health care provider. Make sure you discuss any questions you have with your health care provider. Document Revised: 02/12/2018 Document Reviewed: 03/06/2016 Elsevier Patient Education  [place].

## 2020-02-10 ENCOUNTER — Encounter (HOSPITAL_COMMUNITY)
Admission: RE | Admit: 2020-02-10 | Discharge: 2020-02-10 | Disposition: A | Discharge status: Home / Self Care | Payer: BC Managed Care – PPO | Source: Ambulatory Visit | Attending: Gastroenterology | Admitting: Gastroenterology

## 2020-02-10 ENCOUNTER — Other Ambulatory Visit: Payer: Self-pay

## 2020-02-10 ENCOUNTER — Other Ambulatory Visit (HOSPITAL_COMMUNITY)
Admission: RE | Admit: 2020-02-10 | Discharge: 2020-02-10 | Disposition: A | Discharge status: Home / Self Care | Payer: BC Managed Care – PPO | Source: Ambulatory Visit | Attending: Gastroenterology | Admitting: Gastroenterology

## 2020-02-10 DIAGNOSIS — Z20822 Contact with and (suspected) exposure to covid-19: Secondary | ICD-10-CM | POA: Diagnosis not present

## 2020-02-10 DIAGNOSIS — Z01812 Encounter for preprocedural laboratory examination: Secondary | ICD-10-CM | POA: Diagnosis present

## 2020-02-10 LAB — CBC WITH DIFFERENTIAL/PLATELET
Abs Immature Granulocytes: 0.04 10*3/uL (ref 0.00–0.07)
Basophils Absolute: 0.1 10*3/uL (ref 0.0–0.1)
Basophils Relative: 1 %
Eosinophils Absolute: 0.8 10*3/uL — ABNORMAL HIGH (ref 0.0–0.5)
Eosinophils Relative: 9 %
HCT: 49.5 % (ref 39.0–52.0)
Hemoglobin: 16.2 g/dL (ref 13.0–17.0)
Immature Granulocytes: 0 %
Lymphocytes Relative: 14 %
Lymphs Abs: 1.3 10*3/uL (ref 0.7–4.0)
MCH: 30.1 pg (ref 26.0–34.0)
MCHC: 32.7 g/dL (ref 30.0–36.0)
MCV: 92 fL (ref 80.0–100.0)
Monocytes Absolute: 0.7 10*3/uL (ref 0.1–1.0)
Monocytes Relative: 8 %
Neutro Abs: 6.5 10*3/uL (ref 1.7–7.7)
Neutrophils Relative %: 68 %
Platelets: 214 10*3/uL (ref 150–400)
RBC: 5.38 MIL/uL (ref 4.22–5.81)
RDW: 12.3 % (ref 11.5–15.5)
WBC: 9.5 10*3/uL (ref 4.0–10.5)
nRBC: 0 % (ref 0.0–0.2)

## 2020-02-10 LAB — COMPREHENSIVE METABOLIC PANEL
ALT: 34 U/L (ref 0–44)
AST: 24 U/L (ref 15–41)
Albumin: 4.2 g/dL (ref 3.5–5.0)
Alkaline Phosphatase: 44 U/L (ref 38–126)
Anion gap: 13 (ref 5–15)
BUN: 11 mg/dL (ref 6–20)
CO2: 22 mmol/L (ref 22–32)
Calcium: 9.4 mg/dL (ref 8.9–10.3)
Chloride: 104 mmol/L (ref 98–111)
Creatinine, Ser: 0.65 mg/dL (ref 0.61–1.24)
GFR calc Af Amer: >60 mL/min (ref >60)
GFR calc non Af Amer: >60 mL/min (ref >60)
Glucose, Bld: 104 mg/dL — ABNORMAL HIGH (ref 70–99)
Potassium: 3.8 mmol/L (ref 3.5–5.1)
Sodium: 139 mmol/L (ref 135–145)
Total Bilirubin: 0.7 mg/dL (ref 0.3–1.2)
Total Protein: 7.5 g/dL (ref 6.5–8.1)

## 2020-02-10 LAB — SARS CORONAVIRUS 2 (TAT 6-24 HRS): SARS Coronavirus 2: NEGATIVE

## 2020-02-11 ENCOUNTER — Encounter (HOSPITAL_COMMUNITY): Payer: Self-pay | Admitting: Gastroenterology

## 2020-02-11 ENCOUNTER — Ambulatory Visit (HOSPITAL_COMMUNITY): Payer: BC Managed Care – PPO | Admitting: Anesthesiology

## 2020-02-11 ENCOUNTER — Encounter (HOSPITAL_COMMUNITY)
Admission: RE | Disposition: A | Discharge status: Home / Self Care | Payer: Self-pay | Source: Home / Self Care | Attending: Gastroenterology

## 2020-02-11 ENCOUNTER — Ambulatory Visit (HOSPITAL_COMMUNITY)
Admission: RE | Admit: 2020-02-11 | Discharge: 2020-02-11 | Disposition: A | Discharge status: Home / Self Care | Payer: BC Managed Care – PPO | Attending: Gastroenterology | Admitting: Gastroenterology

## 2020-02-11 ENCOUNTER — Telehealth: Payer: Self-pay | Admitting: Gastroenterology

## 2020-02-11 DIAGNOSIS — K621 Rectal polyp: Secondary | ICD-10-CM

## 2020-02-11 DIAGNOSIS — E669 Obesity, unspecified: Secondary | ICD-10-CM | POA: Insufficient documentation

## 2020-02-11 DIAGNOSIS — Z79899 Other long term (current) drug therapy: Secondary | ICD-10-CM | POA: Diagnosis not present

## 2020-02-11 DIAGNOSIS — G4730 Sleep apnea, unspecified: Secondary | ICD-10-CM | POA: Insufficient documentation

## 2020-02-11 DIAGNOSIS — I10 Essential (primary) hypertension: Secondary | ICD-10-CM | POA: Insufficient documentation

## 2020-02-11 DIAGNOSIS — R197 Diarrhea, unspecified: Secondary | ICD-10-CM | POA: Insufficient documentation

## 2020-02-11 DIAGNOSIS — R1033 Periumbilical pain: Secondary | ICD-10-CM | POA: Diagnosis present

## 2020-02-11 DIAGNOSIS — Q438 Other specified congenital malformations of intestine: Secondary | ICD-10-CM | POA: Insufficient documentation

## 2020-02-11 DIAGNOSIS — E119 Type 2 diabetes mellitus without complications: Secondary | ICD-10-CM | POA: Insufficient documentation

## 2020-02-11 DIAGNOSIS — R1031 Right lower quadrant pain: Secondary | ICD-10-CM | POA: Diagnosis not present

## 2020-02-11 DIAGNOSIS — K219 Gastro-esophageal reflux disease without esophagitis: Secondary | ICD-10-CM | POA: Diagnosis not present

## 2020-02-11 DIAGNOSIS — R112 Nausea with vomiting, unspecified: Secondary | ICD-10-CM

## 2020-02-11 DIAGNOSIS — K5150 Left sided colitis without complications: Secondary | ICD-10-CM | POA: Diagnosis not present

## 2020-02-11 HISTORY — PX: POLYPECTOMY: SHX5525

## 2020-02-11 HISTORY — PX: COLONOSCOPY WITH PROPOFOL: SHX5780

## 2020-02-11 HISTORY — PX: BIOPSY: SHX5522

## 2020-02-11 SURGERY — COLONOSCOPY WITH PROPOFOL
Anesthesia: General

## 2020-02-11 MED ORDER — STERILE WATER FOR IRRIGATION IR SOLN: Status: DC | PRN

## 2020-02-11 MED ORDER — FENTANYL CITRATE (PF) 100 MCG/2ML IJ SOLN
INTRAMUSCULAR | Status: AC
  Filled 2020-02-11: qty 2

## 2020-02-11 MED ORDER — CHLORHEXIDINE GLUCONATE CLOTH 2 % EX PADS: 6.0000 | MEDICATED_PAD | Freq: Once | CUTANEOUS | Status: DC

## 2020-02-11 MED ORDER — PROPOFOL 10 MG/ML IV BOLUS
INTRAVENOUS | Status: AC
  Filled 2020-02-11: qty 40

## 2020-02-11 MED ORDER — LIDOCAINE 2% (20 MG/ML) 5 ML SYRINGE
INTRAMUSCULAR | Status: AC
  Filled 2020-02-11: qty 5

## 2020-02-11 MED ORDER — PROPOFOL 500 MG/50ML IV EMUL
INTRAVENOUS | Status: DC | PRN
  Administered 2020-02-11: 200 ug/kg/min via INTRAVENOUS

## 2020-02-11 MED ORDER — LACTATED RINGERS IV SOLN: INTRAVENOUS | Status: DC | PRN

## 2020-02-11 MED ORDER — LIDOCAINE HCL (CARDIAC) PF 100 MG/5ML IV SOSY
PREFILLED_SYRINGE | INTRAVENOUS | Status: DC | PRN
  Administered 2020-02-11: 50 mg via INTRATRACHEAL

## 2020-02-11 MED ORDER — FENTANYL CITRATE (PF) 100 MCG/2ML IJ SOLN
INTRAMUSCULAR | Status: DC | PRN
  Administered 2020-02-11 (×2): 50 ug via INTRAVENOUS

## 2020-02-11 MED ORDER — PROPOFOL 10 MG/ML IV BOLUS
INTRAVENOUS | Status: DC | PRN
  Administered 2020-02-11 (×2): 100 ug via INTRAVENOUS

## 2020-02-11 MED ORDER — ONDANSETRON HCL 4 MG PO TABS: ORAL_TABLET | ORAL | 1 refills | Status: AC

## 2020-02-11 MED ORDER — LACTATED RINGERS IV SOLN: Freq: Once | INTRAVENOUS | Status: AC

## 2020-02-11 NOTE — H&P (Signed)
Primary Care Physician:  Caren Macadam, MD Primary Gastroenterologist:  Dr. Oneida Alar  Pre-Procedure History & Physical: HPI:  Carlos Wilcox is a 29 y.o. male here for NAUSEA/VOMTIING/DIARRHEA/elevated eosinophils.  Past Medical History:  Diagnosis Date  . Fatty liver   . HTN (hypertension)   . Hyperlipidemia   . Obesity   . Sleep apnea   . Type 2 diabetes mellitus without complication, without long-term current use of insulin ([place]) 01/01/2018    Past Surgical History:  Procedure Laterality Date  . BIOPSY  09/03/2019   Procedure: BIOPSY;  Surgeon: Danie Binder, MD;  Location: [facility];  Service: Endoscopy;;  . ESOPHAGOGASTRODUODENOSCOPY (EGD) WITH PROPOFOL N/A 09/03/2019   Dr. Oneida Alar: Large hiatal hernia, unable to advance scope into the distal stomach.  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 pylori.  Occipital stomach oriented right left orientation.  Small bowel biopsies negative for celiac.  Marland Kitchen KNEE SURGERY  03/2016  . LAPAROSCOPIC APPENDECTOMY N/A 11/13/2017   Procedure: APPENDECTOMY LAPAROSCOPIC;  Surgeon: Aviva Signs, MD;  Location: [facility] ORS;  Service: General;  Laterality: N/A;    Prior to Admission medications   Medication Sig Start Date End Date Taking? Authorizing Provider  hyoscyamine (LEVSIN) 0.125 MG tablet Use 1-2 tabs 30 minutes before breakfast, lunch, supper, and at bedtime for diarrhea/abdominal spams. Patient taking differently: Take 0.25 mg by mouth in the morning, at noon, in the evening, and at bedtime.  01/07/20  Yes Mahala Menghini, PA-C  omeprazole (PRILOSEC) 20 MG capsule 1 po 30 mins prior to meals twice daily Patient taking differently: Take 20 mg by mouth in the morning and at bedtime.  09/03/19  Yes ,  L, MD  sertraline (ZOLOFT) 100 MG tablet Take 100 mg by mouth at bedtime.  07/04/19  Yes [provider]  Na Sulfate-K Sulfate-Mg Sulf (SUPREP BOWEL PREP KIT) 17.5-3.13-1.6 GM/177ML SOLN Take 1 kit by mouth as directed.  12/18/19   , Marga Melnick, MD  ondansetron (ZOFRAN) 4 MG tablet Take 1 tablet (4 mg total) by mouth every 8 (eight) hours as needed for nausea or vomiting. Patient not taking: Reported on 02/04/2020 02/03/20   Margarita Mail, PA-C    Allergies as of 12/18/2019  . (No Known Allergies)    Family History  Problem Relation Age of Onset  . Hypertension Mother   . Breast cancer Mother   . Colon polyps Mother 85  . Hypertension Father     Social History   Socioeconomic History  . Marital status: Single    Spouse name: Not on file  . Number of children: Not on file  . Years of education: Not on file  . Highest education level: Not on file  Occupational History  . Occupation: Designer, industrial/product  Tobacco Use  . Smoking status: Never Smoker  . Smokeless tobacco: Never Used  Substance and Sexual Activity  . Alcohol use: Not Currently    Comment: social drinker. Less than 1 drink per week  . Drug use: No  . Sexual activity: Never  Other Topics Concern  . Not on file  Social History Narrative   Lives with parents. Grew up in [place], [place]. Worked at [employer] for a year. Eats all food groups.    Social Determinants of Health   Financial Resource Strain:   . Difficulty of Paying Living Expenses:   Food Insecurity:   . Worried About Charity fundraiser in the Last Year:   . [employer] of  Food in the Last Year:   Transportation Needs:   . Film/video editor (Medical):   Marland Kitchen Lack of Transportation (Non-Medical):   Physical Activity:   . Days of Exercise per Week:   . Minutes of Exercise per Session:   Stress:   . Feeling of Stress :   Social Connections:   . Frequency of Communication with Friends and Family:   . Frequency of Social Gatherings with Friends and Family:   . Attends Religious Services:   . Active Member of Clubs or Organizations:   . Attends Archivist Meetings:   Marland Kitchen Marital Status:   Intimate Partner Violence:   . Fear of Current or  Ex-Partner:   . Emotionally Abused:   Marland Kitchen Physically Abused:   . Sexually Abused:     Review of Systems: See HPI, otherwise negative ROS   Physical Exam: BP 128/86   Pulse 82   Temp 98 F (36.7 C) (Oral)   Resp 18   SpO2 97%  General:   Alert,  pleasant and cooperative in NAD Head:  Normocephalic and atraumatic. Neck:  Supple; Lungs:  Clear throughout to auscultation.    Heart:  Regular rate and rhythm. Abdomen:  Soft, nontender and nondistended. Normal bowel sounds, without guarding, and without rebound.   Neurologic:  Alert and  oriented x4;  grossly normal neurologically.  Impression/Plan:     NAUSEA/VOMTIING/DIARRHEA/elevated eosinophils  PLAN: TCS TODAY WITH BIOPSY. DISCUSSED PROCEDURE, BENEFITS, & RISKS: < 1% chance of medication reaction, bleeding, perforation, ASPIRATION, or rupture of spleen/liver requiring surgery to fix it and missed polyps < 1 cm 10-20% of the time.

## 2020-02-11 NOTE — Op Note (Signed)
[facility] Patient Name: Carlos Wilcox Procedure Date: 02/11/2020 8:52 AM MRN: [id number] Date of Birth: 1991-02-18 Attending MD: Barney Drain MD, MD CSN: [id number] Age: 29 Admit Type: Outpatient Procedure:                Colonoscopy WITH COLD FORCEPS BIOPSY/COLD SNARE                            POLYPECTOMY Indications:              Periumbilical abdominal pain, Abdominal pain in the                            right lower quadrant, Clinically significant                            diarrhea of unexplained origin. ELEVATED                            EOSINOPHILS. LEVSIN HELPS. Providers:                Barney Drain MD, MD, Janeece Riggers, RN, Tammy Vaught,                            RN, Thomas Hoff., Technician Referring MD:             Caren Macadam Medicines:                Propofol per Anesthesia Complications:            No immediate complications. Estimated Blood Loss:     Estimated blood loss was minimal. Procedure:                Pre-Anesthesia Assessment:                           - Prior to the procedure, a History and Physical                            was performed, and patient medications and                            allergies were reviewed. The patient's tolerance of                            previous anesthesia was also reviewed. The risks                            and benefits of the procedure and the sedation                            options and risks were discussed with the patient.                            All questions were answered, and informed consent  was obtained. Prior Anticoagulants: The patient has                            taken no previous anticoagulant or antiplatelet                            agents. ASA Grade Assessment: II - A patient with                            mild systemic disease. After reviewing the risks                            and benefits, the patient was deemed in   satisfactory condition to undergo the procedure.                            After obtaining informed consent, the colonoscope                            was passed under direct vision. Throughout the                            procedure, the patient's blood pressure, pulse, and                            oxygen saturations were monitored continuously. The                            CF-[id number] ([id number]) scope was introduced through                            the anus and advanced to the 10 cm into the ileum.                            The colonoscopy was somewhat difficult due to a                            tortuous colon. Successful completion of the                            procedure was aided by straightening and shortening                            the scope to obtain bowel loop reduction and                            COLOWRAP. The patient tolerated the procedure well.                            The quality of the bowel preparation was excellent.                            The terminal ileum, ileocecal valve, appendiceal  orifice, and rectum were photographed. Scope In: 9:36:42 AM Scope Out: 9:57:30 AM Scope Withdrawal Time: 0 hours 18 minutes 47 seconds  Total Procedure Duration: 0 hours 20 minutes 48 seconds  Findings:      The terminal ileum appeared normal.      The splenic flexure, transverse colon, ascending colon and cecum       appeared normal. Biopsies(BTL 1) for histology were taken with a cold       forceps from the cecum, ascending colon and transverse colon for       evaluation of microscopic colitis.      Patchy moderate inflammation characterized by congestion (edema),       erosions, erythema and shallow ulcerations was found in the mid sigmoid       colon and in the distal descending colon. Biopsies[(BTL 2: CENTRAL(3),       PERIPHERAL > 6)] were taken with a cold forceps for histology.      A 5 mm polyp was found in the rectum. The polyp  was sessile. The polyp       was removed with a cold snare. Resection and retrieval were complete(BTL       3).      The rectum appeared normal. Biopsies(BTL 3) were taken with a cold       forceps for histology.      The recto-sigmoid colon and sigmoid colon were mildly tortuous. Impression:               - The examined portion of the ileum was normal.                           - DIARRHEA DUE TO LEFT COLITIS                           - One 5 mm polyp in the rectum, removed with a cold                            snare. Resected and retrieved.                           - The rectum is normal. Biopsied.                           - Tortuous LEFT colon. Moderate Sedation:      Per Anesthesia Care Recommendation:           - Patient has a contact number available for                            emergencies. The signs and symptoms of potential                            delayed complications were discussed with the                            patient. Return to normal activities tomorrow.                            Written discharge instructions were provided to the  patient.                           - Lactose free diet.                           - Continue present medications.                           - Await pathology results.                           - Repeat colonoscopy AGE 61 FOR SURVEILLANCE.                           - Return to GI office in 4 months. Procedure Code(s):        --- Professional ---                           [phone], Colonoscopy, flexible; with removal of                            tumor(s), polyp(s), or other lesion(s) by snare                            technique                           45380, 59, Colonoscopy, flexible; with biopsy,                            single or multiple Diagnosis Code(s):        --- Professional ---                           K51.50, Left sided colitis without complications                           K62.1, Rectal  polyp                           S92.33, Periumbilical pain                           R10.31, Right lower quadrant pain                           R19.7, Diarrhea, unspecified                           Q43.8, Other specified congenital malformations of                            intestine CPT copyright 2019 American Medical Association. All rights reserved. The codes documented in this report are preliminary and upon coder review may  be revised to meet current compliance requirements. Barney Drain, MD Barney Drain MD, MD 02/11/2020 10:16:04 AM This report has been signed electronically. Number of Addenda: 0

## 2020-02-11 NOTE — Discharge Instructions (Signed)
THE LAST PART OF YOUR SMALL BOWEL IS NORMAL. YOUR LOOSE STOOLS ARE MOST LIKELY DUE TO inflammation in your LEFT COLON. You have internal & EXTERNAL hemorrhoids. You had ONE POLYP REMOVED FROM THE RECTUM. I BIOPSIED YOUR RIGHT COLON, LEFT COLON, AND RECTUM.   DRINK WATER TO KEEP YOUR URINE LIGHT YELLOW.  FOLLOW A LACTOSE DIET FOR ONE MONTH. SEE INFO BELOW. IF YOU MUST HAVE DAIRY, ADD LACTASE 3 PILLS WITH MEALS UP TO THREE TIMES A DAY.   CONTINUE LEVSIN.  CONTINUE PRILOSEC.  USE PREPARATION H FOUR TIMES  A DAY IF NEEDED TO RELIEVE RECTAL PAIN/PRESSURE/BLEEDING.   YOUR BIOPSY RESULTS WILL BE BACK IN 5 BUSINESS DAYS.  SEE SURGERY TO FIX YOUR HIATAL HERNIA.  FOLLOW UP IN 4 MOS.      Colonoscopy Care After Read the instructions outlined below and refer to this sheet in the next week. These discharge instructions provide you with general information on caring for yourself after you leave the hospital. While your treatment has been planned according to the most current medical practices available, unavoidable complications occasionally occur. If you have any problems or questions after discharge, call DR. Naeemah Jasmer, [phone].  ACTIVITY  You may resume your regular activity, but move at a slower pace for the next 24 hours.   Take frequent rest periods for the next 24 hours.   Walking will help get rid of the air and reduce the bloated feeling in your belly (abdomen).   No driving for 24 hours (because of the medicine (anesthesia) used during the test).   You may shower.   Do not sign any important legal documents or operate any machinery for 24 hours (because of the anesthesia used during the test).    NUTRITION  Drink plenty of fluids.   You may resume your normal diet as instructed by your doctor.   Begin with a light meal and progress to your normal diet. Heavy or fried foods are harder to digest and may make you feel sick to your stomach (nauseated).   Avoid alcoholic  beverages for 24 hours or as instructed.    MEDICATIONS  You may resume your normal medications.   WHAT YOU CAN EXPECT TODAY  Some feelings of bloating in the abdomen.   Passage of more gas than usual.   Spotting of blood in your stool or on the toilet paper  .  IF YOU HAD POLYPS REMOVED DURING THE COLONOSCOPY:  Eat a soft diet IF YOU HAVE NAUSEA, BLOATING, ABDOMINAL PAIN, OR VOMITING.    FINDING OUT THE RESULTS OF YOUR TEST Not all test results are available during your visit. DR. Oneida Alar WILL CALL YOU WITHIN 7 DAYS OF YOUR PROCEDUE WITH YOUR RESULTS. Do not assume everything is normal if you have not heard from DR. Jennalyn Cawley IN ONE WEEK, CALL HER OFFICE AT [phone].  SEEK IMMEDIATE MEDICAL ATTENTION AND CALL THE OFFICE: [phone] IF:  You have more than a spotting of blood in your stool.   Your belly is swollen (abdominal distention).   You are nauseated or vomiting.   You have a temperature over 101F.   You have abdominal pain or discomfort that is severe or gets worse throughout the day.  Lactose Free Diet Lactose is a carbohydrate that is found mainly in milk and milk products, as well as in foods with added milk or whey. Lactose must be digested by the enzyme in order to be used by the body. Lactose intolerance occurs when there is a  shortage of lactase. When your body is not able to digest lactose, you may feel sick to your stomach (nausea), bloating, cramping, gas and diarrhea.  There are many dairy products that may be tolerated better than milk by some people:  The use of cultured dairy products such as yogurt, buttermilk, cottage cheese, and sweet acidophilus milk (Kefir) for lactase-deficient individuals is usually well tolerated. This is because the healthy bacteria help digest lactose.   Lactose-hydrolyzed milk (Lactaid) contains 40-90% less lactose than milk and may also be well tolerated.    SPECIAL NOTES  Lactose is a carbohydrates. The major food  source is dairy products. Reading food labels is important. Many products contain lactose even when they are not made from milk. Look for the following words: whey, milk solids, dry milk solids, nonfat dry milk powder. Typical sources of lactose other than dairy products include breads, candies, cold cuts, prepared and processed foods, and commercial sauces and gravies.   All foods must be prepared without milk, cream, or other dairy foods.   Soy milk and lactose-free supplements (LACTASE) may be used as an alternative to milk.   FOOD GROUP ALLOWED/RECOMMENDED AVOID/USE SPARINGLY  BREADS / STARCHES 4 servings or more* Breads and rolls made without milk. [place], [place], or [place] bread. Breads and rolls that contain milk. Prepared mixes such as muffins, biscuits, waffles, pancakes. Sweet rolls, donuts, [place] toast (if made with milk or lactose).  Crackers: Soda crackers, graham crackers. Any crackers prepared without lactose. Zwieback crackers, corn curls, or any that contain lactose.  Cereals: Cooked or dry cereals prepared without lactose (read labels). Cooked or dry cereals prepared with lactose (read labels). Total, Cocoa Krispies. Special K.  Potatoes / Pasta / Rice: Any prepared without milk or lactose. Popcorn. Instant potatoes, frozen [place] fries, scalloped or au gratin potatoes.  VEGETABLES 2 servings or more Fresh, frozen, and canned vegetables. Creamed or breaded vegetables. Vegetables in a cheese sauce or with lactose-containing margarines.  FRUIT 2 servings or more All fresh, canned, or frozen fruits that are not processed with lactose. Any canned or frozen fruits processed with lactose.  MEAT & SUBSTITUTES 2 servings or more (4 to 6 oz. total per day) Plain beef, chicken, fish, [place], lamb, veal, pork, or ham. Kosher prepared meat products. Strained or junior meats that do not contain milk. Eggs, soy meat substitutes, nuts. Scrambled eggs, omelets, and souffles that contain  milk. Creamed or breaded meat, fish, or fowl. Sausage products such as wieners, liver sausage, or cold cuts that contain milk solids. Cheese, cottage cheese, or cheese spreads.  MILK None. (See "BEVERAGES" for milk substitutes. See "DESSERTS" for ice cream and frozen desserts.) Milk (whole, 2%, skim, or chocolate). Evaporated, powdered, or condensed milk; malted milk.  SOUPS & COMBINATION FOODS Bouillon, broth, vegetable soups, clear soups, consomms. Homemade soups made with allowed ingredients. Combination or prepared foods that do not contain milk or milk products (read labels). Cream soups, chowders, commercially prepared soups containing lactose. Macaroni and cheese, pizza. Combination or prepared foods that contain milk or milk products.  DESSERTS & SWEETS In moderation Water and fruit ices; gelatin; angel food cake. Homemade cookies, pies, or cakes made from allowed ingredients. Pudding (if made with water or a milk substitute). Lactose-free tofu desserts. Sugar, honey, corn syrup, jam, jelly; marmalade; molasses (beet sugar); Pure sugar candy; marshmallows. Ice cream, ice milk, sherbet, custard, pudding, frozen yogurt. Commercial cake and cookie mixes. Desserts that contain chocolate. Pie crust made with milk-containing margarine; reduced-calorie desserts  made with a sugar substitute that contains lactose. Toffee, peppermint, butterscotch, chocolate, caramels.  FATS & OILS In moderation Butter (as tolerated; contains very small amounts of lactose). Margarines and dressings that do not contain milk, Vegetable oils, shortening, Miracle Whip, mayonnaise, nondairy cream & whipped toppings without lactose or milk solids added (examples: Coffee Rich, Carnation Coffeemate, Rich's Whipped Topping, PolyRich). Berniece Salines. Margarines and salad dressings containing milk; cream, cream cheese; peanut butter with added milk solids, sour cream, chip dips, made with sour cream.  BEVERAGES Carbonated drinks; tea; coffee  and freeze-dried coffee; some instant coffees (check labels). Fruit drinks; fruit and vegetable juice; Rice or Soy milk. Ovaltine, hot chocolate. Some cocoas; some instant coffees; instant iced teas; powdered fruit drinks (read labels).   CONDIMENTS / MISCELLANEOUS Soy sauce, carob powder, olives, gravy made with water, baker's cocoa, pickles, pure seasonings and spices, wine, pure monosodium glutamate, catsup, mustard. Some chewing gums, chocolate, some cocoas. Certain antibiotics and vitamin / mineral preparations. Spice blends if they contain milk products. MSG extender. Artificial sweeteners that contain lactose such as Equal (Nutra-Sweet) and Sweet 'n Low. Some nondairy creamers (read labels).

## 2020-02-11 NOTE — Telephone Encounter (Signed)
Patient already has referral in process to [employer]. FYI regarding FMLA to susan/stacey

## 2020-02-11 NOTE — Telephone Encounter (Signed)
REFER TO SURGERY FOR HIATAL HERNIA REPAIR, YT:[id number] NAUSEA/VOMTING, WEIGHT LOSS, LARGE HIATAL HERNIA.  IN ENDOSCOPY, PT NEEDS TO BRING FMLA PAPERWORK TO COVER MISSED WORK DAYS. PATIENT VOICED HIS UNDERSTANDING. 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

## 2020-02-11 NOTE — Telephone Encounter (Signed)
I will forward FMLA to JL when they arrive

## 2020-02-11 NOTE — Transfer of Care (Signed)
Immediate Anesthesia Transfer of Care Note  Patient: Carlos Wilcox  Procedure(s) Performed: COLONOSCOPY WITH PROPOFOL (N/A ) BIOPSY POLYPECTOMY  Patient Location: PACU  Anesthesia Type:General  Level of Consciousness: awake, alert  and oriented  Airway & Oxygen Therapy: Patient Spontanous Breathing  Post-op Assessment: Report given to RN, Post -op Vital signs reviewed and stable and Patient moving all extremities X 4  Post vital signs: Reviewed and stable  Last Vitals:  Vitals Value Taken Time  BP    Temp    Pulse    Resp    SpO2      Last Pain:  Vitals:   02/11/20 0840  TempSrc: Oral  PainSc: 0-No pain      Patients Stated Pain Goal: 8 ([id number])  Complications: No apparent anesthesia complications

## 2020-02-11 NOTE — Anesthesia Preprocedure Evaluation (Signed)
Anesthesia Evaluation  Patient identified by MRN, date of birth, ID band Patient awake    Reviewed: Allergy & Precautions, NPO status , Patient's Chart, lab work & pertinent test results, reviewed documented beta blocker date and time   History of Anesthesia Complications Negative for: history of anesthetic complications  Airway Mallampati: III  TM Distance: >3 FB Neck ROM: Full    Dental no notable dental hx.    Pulmonary sleep apnea and Continuous Positive Airway Pressure Ventilation ,    Pulmonary exam normal breath sounds clear to auscultation       Cardiovascular Exercise Tolerance: Good hypertension, Normal cardiovascular exam Rhythm:Regular Rate:Normal     Neuro/Psych negative neurological ROS  negative psych ROS   GI/Hepatic Neg liver ROS, GERD  Medicated and Controlled,Vomiting/diarrhea    Endo/Other  diabetes, Well Controlled  Renal/GU negative Renal ROS  negative genitourinary   Musculoskeletal negative musculoskeletal ROS (+)   Abdominal   Peds negative pediatric ROS (+)  Hematology negative hematology ROS (+)   Anesthesia Other Findings   Reproductive/Obstetrics negative OB ROS                             Anesthesia Physical  Anesthesia Plan  ASA: II  Anesthesia Plan: General   Post-op Pain Management:    Induction: Intravenous  PONV Risk Score and Plan: 2 and Treatment may vary due to age or medical condition and Propofol infusion  Airway Management Planned: Nasal Cannula, Natural Airway and Simple Face Mask  Additional Equipment:   Intra-op Plan:   Post-operative Plan:   Informed Consent: I have reviewed the patients History and Physical, chart, labs and discussed the procedure including the risks, benefits and alternatives for the proposed anesthesia with the patient or authorized representative who has indicated his/her understanding and acceptance.      Dental advisory given  Plan Discussed with: CRNA and Surgeon  Anesthesia Plan Comments:        Anesthesia Quick Evaluation

## 2020-02-11 NOTE — Anesthesia Postprocedure Evaluation (Signed)
Anesthesia Post Note  Patient: Omare Bilotta  Procedure(s) Performed: COLONOSCOPY WITH PROPOFOL (N/A ) BIOPSY POLYPECTOMY  Patient location during evaluation: Phase II Anesthesia Type: General Level of consciousness: awake and alert and patient cooperative Pain management: satisfactory to patient Vital Signs Assessment: post-procedure vital signs reviewed and stable Respiratory status: spontaneous breathing Cardiovascular status: stable Postop Assessment: no apparent nausea or vomiting Anesthetic complications: no     Last Vitals:  Vitals:   02/11/20 1015 02/11/20 1030  BP: 105/60 106/65  Pulse: 74 74  Resp: 15 18  Temp:  36.5 C  SpO2: 93% 95%    Last Pain:  Vitals:   02/11/20 1030  TempSrc: Oral  PainSc: 0-No pain                 Kaden Dunkel

## 2020-02-12 LAB — SURGICAL PATHOLOGY

## 2020-02-13 ENCOUNTER — Telehealth: Payer: Self-pay | Admitting: Gastroenterology

## 2020-02-13 DIAGNOSIS — I998 Other disorder of circulatory system: Secondary | ICD-10-CM

## 2020-02-13 NOTE — Addendum Note (Signed)
Addended by: Hassan Rowan on: 02/13/2020 01:55 PM   Modules accepted: Orders

## 2020-02-13 NOTE — Telephone Encounter (Signed)
PA for CT angio approved via [employer] website. Order ID: [id number], valid 02/13/20-08/10/20.  CT scheduled for 02/19/20 at 12:00pm, arrive at 11:45am. Liquids only for 4 hours before test.  Tried to call pt, no answer home number, VM full. Tried to call mobile number, no answer, LMOVM for return call. MyChart message sent to pt to inform him of appt.

## 2020-02-13 NOTE — Telephone Encounter (Signed)
NEEDS CT ANGIO OF THE ABD/PELVIS WITHIN 7 DAYS.

## 2020-02-17 ENCOUNTER — Encounter: Payer: Self-pay | Admitting: Emergency Medicine

## 2020-02-19 ENCOUNTER — Other Ambulatory Visit: Payer: Self-pay

## 2020-02-19 ENCOUNTER — Ambulatory Visit (HOSPITAL_COMMUNITY)
Admission: RE | Admit: 2020-02-19 | Discharge: 2020-02-19 | Disposition: A | Discharge status: Home / Self Care | Payer: BC Managed Care – PPO | Source: Ambulatory Visit | Attending: Gastroenterology | Admitting: Gastroenterology

## 2020-02-19 DIAGNOSIS — I998 Other disorder of circulatory system: Secondary | ICD-10-CM | POA: Diagnosis present

## 2020-02-19 MED ORDER — IOHEXOL 350 MG/ML SOLN
100.0000 mL | Freq: Once | INTRAVENOUS | Status: AC | PRN
  Administered 2020-02-19: 100 mL via INTRAVENOUS

## 2020-02-21 ENCOUNTER — Encounter: Payer: Self-pay | Admitting: Emergency Medicine

## 2020-02-24 NOTE — Telephone Encounter (Signed)
FMLA papers done and given to Roland.

## 2020-02-24 NOTE — Telephone Encounter (Signed)
Copies made for scanning and patient is aware of $29 fee

## 2020-02-26 ENCOUNTER — Encounter: Payer: Self-pay | Admitting: Gastroenterology

## 2020-03-06 ENCOUNTER — Other Ambulatory Visit (HOSPITAL_COMMUNITY): Payer: BC Managed Care – PPO

## 2020-03-10 ENCOUNTER — Other Ambulatory Visit: Payer: Self-pay | Admitting: General Surgery

## 2020-03-10 DIAGNOSIS — K449 Diaphragmatic hernia without obstruction or gangrene: Secondary | ICD-10-CM

## 2020-03-12 ENCOUNTER — Ambulatory Visit (HOSPITAL_COMMUNITY)
Admission: RE | Admit: 2020-03-12 | Discharge: 2020-03-12 | Disposition: A | Discharge status: Home / Self Care | Payer: BC Managed Care – PPO | Source: Ambulatory Visit | Attending: General Surgery | Admitting: General Surgery

## 2020-03-12 ENCOUNTER — Other Ambulatory Visit: Payer: Self-pay | Admitting: General Surgery

## 2020-03-12 ENCOUNTER — Other Ambulatory Visit: Payer: Self-pay

## 2020-03-12 DIAGNOSIS — K449 Diaphragmatic hernia without obstruction or gangrene: Secondary | ICD-10-CM | POA: Diagnosis present

## 2020-03-16 ENCOUNTER — Encounter: Payer: Self-pay | Admitting: Gastroenterology

## 2020-03-19 NOTE — Telephone Encounter (Signed)
Called pt and stated he started having diarrhea and nausea. Denies fever, no blood in his stool and has not tried any otc medication. He stated this started having d/n on 4/14 and 4/15 while eating butter but did not contact us or f/u since his last visit in jan. However, pt is requesting a note to return back to work on yesterday 03/18/20

## 2020-07-02 ENCOUNTER — Ambulatory Visit: Payer: BC Managed Care – PPO | Admitting: Gastroenterology

## 2020-08-21 NOTE — Progress Notes (Deleted)
Referring Provider: Caren Macadam, MD Primary Care Physician:  Caren Macadam, MD Primary GI Physician: Dr. Abbey Chatters  No chief complaint on file.   HPI:   Carlos Wilcox is a 29 y.o. male presenting today for follow-up.  He has history of large hiatal hernia, nausea with vomiting, diarrhea, elevated absolute eosinophil count, and weight loss. Last seen in our office in February 2021 for the same and continued with diarrhea taking Bentyl TID and imodium as needed. Also continued with intermittent vomiting.  Bentyl was changed to Levsin, advised to use Imodium as needed, Fiberchoice 2 daily, colonoscopy as planned, and refer to [facility] surgery for consideration of hiatal hernia repair. He has had significant GI evaluation for his symptoms as detailed below.   EGD 09/03/2019 large hiatal hernia suspected to be the cause of his nausea and vomiting.  Proximal stomach oriented right to left orientation, mild erythema in the gastric body s/p biopsied which showed chronic gastritis consistent with PPI use, no H. pylori.  Duodenal biopsies for celiac were negative.  GES within normal limits in October 2020.   Laboratory evaluation has included celiac screen negative, thyroid function normal, alpha gal negative, O&P stool test negative, GI pathogen panel negative. He did have elevation of peripheral absolute eosinophils s/p course of prednisone in November 2020. Repeat CBC in March 2021 with eosinophils significantly lower at 0.8. This is still elevated but down from 2,861 in September 2020.   Colonoscopy 02/11/2020 which showed which showed normal TI, normal cecum, ascending colon, transverse colon, and splenic flexure s/p biopsies for microscopic colitis.  Patchy inflammation with edema, erosions, erythema, and shallow ulcerations in sigmoid colon and distal descending colon s/p biopsy.  5 mm rectal polyp.  Right sided random colon biopsies were benign.  Left colon biopsies with ischemic type  changes.  Rectal biopsies were benign, polyp was hyperplastic.  Recommended CT angiography for further evaluation, referral to surgery to address hiatal hernia, continue Levsin, continue Prilosec, next colonoscopy at age 58.  CT angiography March 2021 with no mesenteric ischemia, moderate colonic stool burden, left adrenal gland nodule favored to be benign.  Per Dr. Oneida Alar, changes on the colonoscopy were due to dehydration from colon prep.    Also with history of elevated LFTs thought to be secondary to fatty liver.  Ultrasound in September 2020 with hepatic steatosis. Iron panel within normal limits, hepatitis C antibody negative, immune to hepatitis B, HIV negative, RPR negative.  LFTs returned to normal in March 2021.  Today:    Could consider Colestid.  Trial of Xifaxan?   Past Medical History:  Diagnosis Date  . Fatty liver   . HTN (hypertension)   . Hyperlipidemia   . Obesity   . Sleep apnea   . Type 2 diabetes mellitus without complication, without long-term current use of insulin ([place]) 01/01/2018    Past Surgical History:  Procedure Laterality Date  . BIOPSY  09/03/2019   Procedure: BIOPSY;  Surgeon: Danie Binder, MD;  Location: [facility];  Service: Endoscopy;;  . BIOPSY  02/11/2020   Procedure: BIOPSY;  Surgeon: Danie Binder, MD;  Location: [facility];  Service: Endoscopy;;  . COLONOSCOPY WITH PROPOFOL N/A 02/11/2020   Procedure: COLONOSCOPY WITH PROPOFOL;  Surgeon: Danie Binder, MD;  Location: [facility];  Service: Endoscopy;  Laterality: N/A;  2:15pm-office rescheduled to 3/16 @ 9:45am  . ESOPHAGOGASTRODUODENOSCOPY (EGD) WITH PROPOFOL N/A 09/03/2019   Dr. Oneida Alar: Large hiatal hernia, unable to advance scope  into the distal stomach.  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 pylori.  Occipital stomach oriented right left orientation.  Small bowel biopsies negative for celiac.  [facility] KNEE SURGERY  03/2016  . LAPAROSCOPIC APPENDECTOMY N/A 11/13/2017    Procedure: APPENDECTOMY LAPAROSCOPIC;  Surgeon: Aviva Signs, MD;  Location: [facility];  Service: General;  Laterality: N/A;  . POLYPECTOMY  02/11/2020   Procedure: POLYPECTOMY;  Surgeon: Danie Binder, MD;  Location: [facility];  Service: Endoscopy;;    Current Outpatient Medications  Medication Sig Dispense Refill  . hyoscyamine (LEVSIN) 0.125 MG tablet Use 1-2 tabs 30 minutes before breakfast, lunch, supper, and at bedtime for diarrhea/abdominal spams. (Patient taking differently: Take 0.25 mg by mouth in the morning, at noon, in the evening, and at bedtime. ) 240 tablet 3  . omeprazole (PRILOSEC) 20 MG capsule 1 po 30 mins prior to meals twice daily (Patient taking differently: Take 20 mg by mouth in the morning and at bedtime. ) 60 capsule 11  . ondansetron (ZOFRAN) 4 MG tablet 1-2 po q6h prn nausea/vomiting 45 tablet 1  . sertraline (ZOLOFT) 100 MG tablet Take 100 mg by mouth at bedtime.      No current facility-administered medications for this visit.    Allergies as of 08/24/2020  . (No Known Allergies)    Family History  Problem Relation Age of Onset  . Hypertension Mother   . Breast cancer Mother   . Colon polyps Mother 53  . Hypertension Father     Social History   Socioeconomic History  . Marital status: Single    Spouse name: Not on file  . Number of children: Not on file  . Years of education: Not on file  . Highest education level: Not on file  Occupational History  . Occupation: Designer, industrial/product  Tobacco Use  . Smoking status: Never Smoker  . Smokeless tobacco: Never Used  Vaping Use  . Vaping Use: Never used  Substance and Sexual Activity  . Alcohol use: Not Currently    Comment: social drinker. Less than 1 drink per week  . Drug use: No  . Sexual activity: Never  Other Topics Concern  . Not on file  Social History Narrative   Lives with parents. Grew up in [place], [place]. Worked at [employer] for a year. Eats all food groups.     Social Determinants of Health   Financial Resource Strain:   . Difficulty of Paying Living Expenses: Not on file  Food Insecurity:   . Worried About Charity fundraiser in the Last Year: Not on file  . Ran Out of Food in the Last Year: Not on file  Transportation Needs:   . Lack of Transportation (Medical): Not on file  . Lack of Transportation (Non-Medical): Not on file  Physical Activity:   . Days of Exercise per Week: Not on file  . Minutes of Exercise per Session: Not on file  Stress:   . Feeling of Stress : Not on file  Social Connections:   . Frequency of Communication with Friends and Family: Not on file  . Frequency of Social Gatherings with Friends and Family: Not on file  . Attends Religious Services: Not on file  . Active Member of Clubs or Organizations: Not on file  . Attends Archivist Meetings: Not on file  . Marital Status: Not on file    Review of Systems: Gen: Denies fever, chills, anorexia. Denies fatigue, weakness,  weight loss.  CV: Denies chest pain, palpitations, syncope, peripheral edema, and claudication. Resp: Denies dyspnea at rest, cough, wheezing, coughing up blood, and pleurisy. GI: Denies vomiting blood, jaundice, and fecal incontinence.   Denies dysphagia or odynophagia. Derm: Denies rash, itching, dry skin Psych: Denies depression, anxiety, memory loss, confusion. No homicidal or suicidal ideation.  Heme: Denies bruising, bleeding, and enlarged lymph nodes.  Physical Exam: There were no vitals taken for this visit. General:   Alert and oriented. No distress noted. Pleasant and cooperative.  Head:  Normocephalic and atraumatic. Eyes:  Conjuctiva clear without scleral icterus. Mouth:  Oral mucosa pink and moist. Good dentition. No lesions. Heart:  S1, S2 present without murmurs appreciated. Lungs:  Clear to auscultation bilaterally. No wheezes, rales, or rhonchi. No distress.  Abdomen:  +BS, soft, non-tender and non-distended. No  rebound or guarding. No HSM or masses noted. Msk:  Symmetrical without gross deformities. Normal posture. Extremities:  Without edema. Neurologic:  Alert and  oriented x4 Psych:  Alert and cooperative. Normal mood and affect.

## 2020-08-24 ENCOUNTER — Ambulatory Visit: Payer: BC Managed Care – PPO | Admitting: Gastroenterology

## 2020-08-24 ENCOUNTER — Encounter: Payer: Self-pay | Admitting: Internal Medicine

## 2020-08-29 IMAGING — RF DG ESOPHAGUS
7 series · 13 of 24 positions shown · non-contrast
Comparison: None

CLINICAL DATA: None hiatal hernia, assess size, sometimes food
feels like it sticks, history hypertension, type II diabetes
mellitus

EXAM:
ESOPHOGRAM / BARIUM SWALLOW / BARIUM TABLET STUDY
TECHNIQUE: Combined double contrast and single contrast examination performed
using effervescent crystals, thick barium liquid, and thin barium
liquid. The patient was observed with fluoroscopy swallowing a 13 mm
barium sulphate tablet.
FLUOROSCOPY TIME:  Fluoroscopy Time:  1 minutes 24 seconds
Radiation Exposure Index (if provided by the fluoroscopic device):
37.8 mGy
Number of Acquired Spot Images: multiple fluoroscopic screen
captures

[Series 1: cp_standard · 0.17mm/px · 2 of 117 frames shown (1 of 7)]
[frame 11/117]
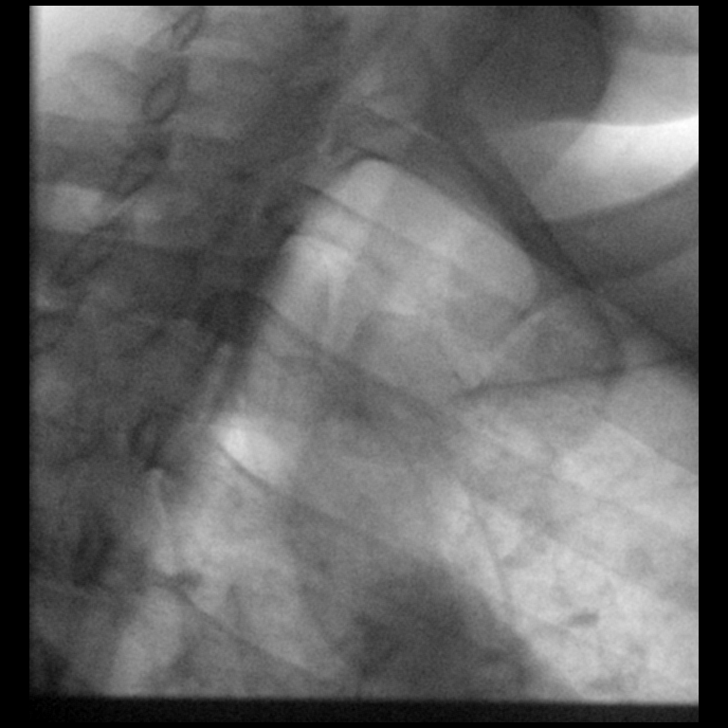
[frame 59/117]
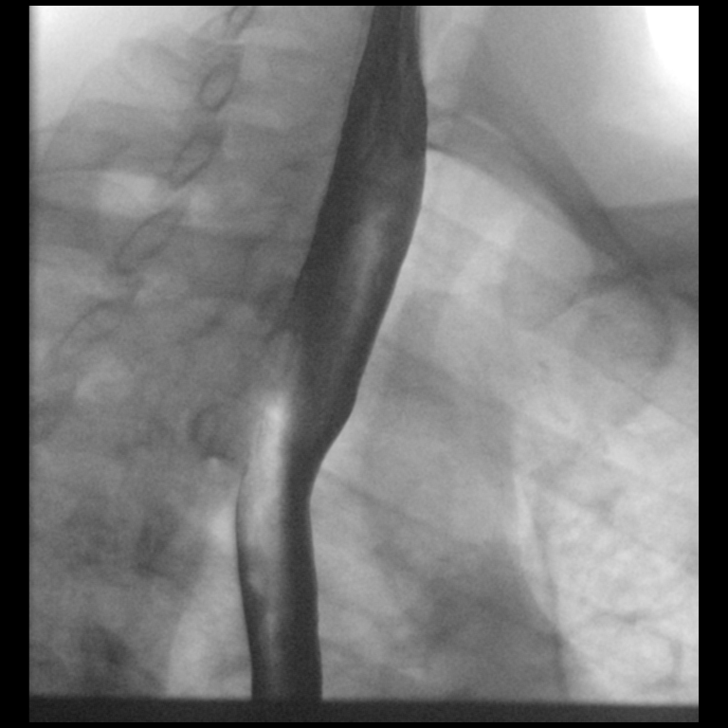

[Series 2: cp_standard · 0.17mm/px · 2 of 133 frames shown (2 of 7)]
[frame 65/133]
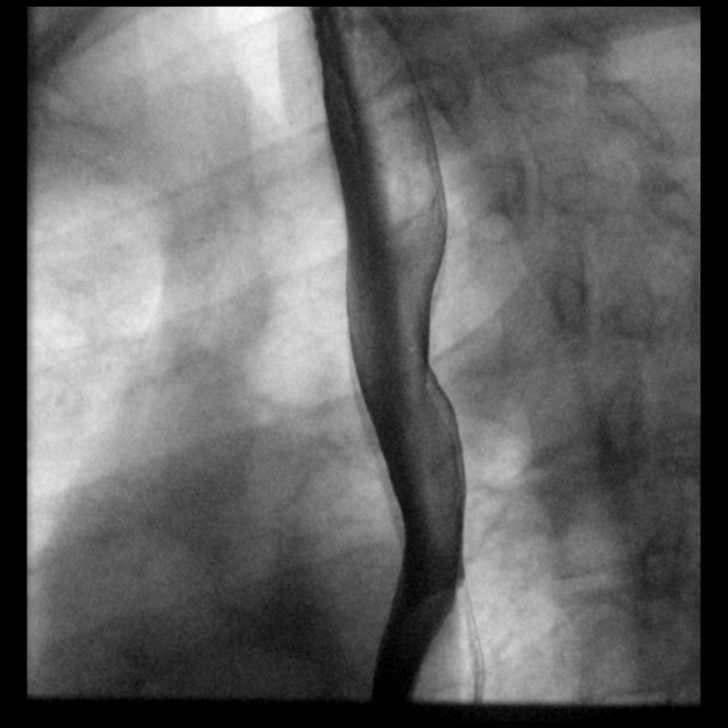
[frame 114/133]
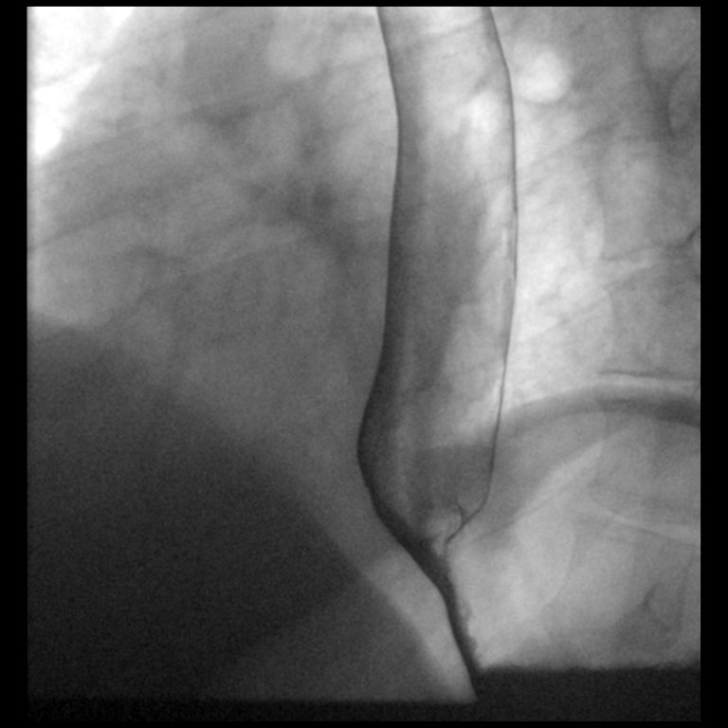

[Series 3: cp_standard · 0.17mm/px · 1 of 43 frames shown (3 of 7)]
[frame 22/43]
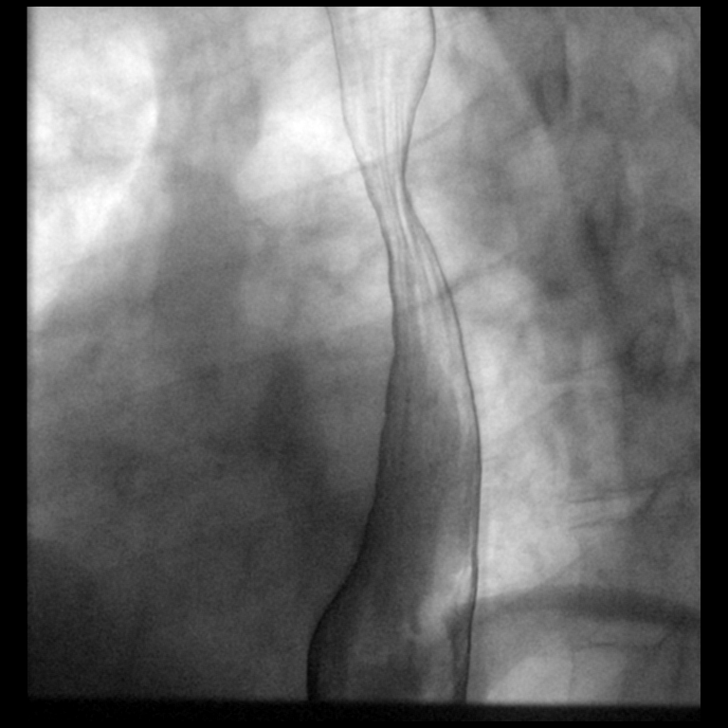

[Series 4: cp_standard · 0.26mm/px · 3 of 156 frames shown (4 of 7)]
[frame 24/156]
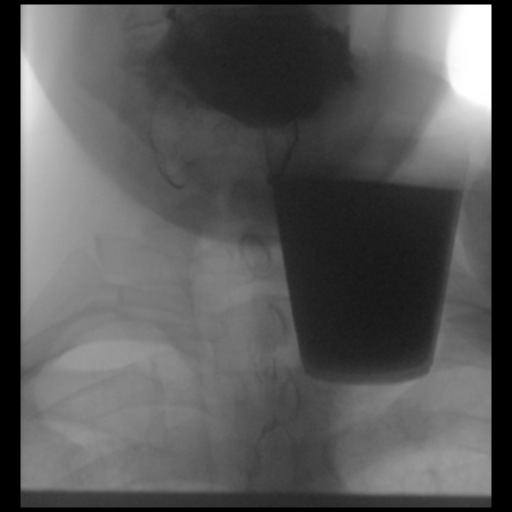
[frame 79/156]
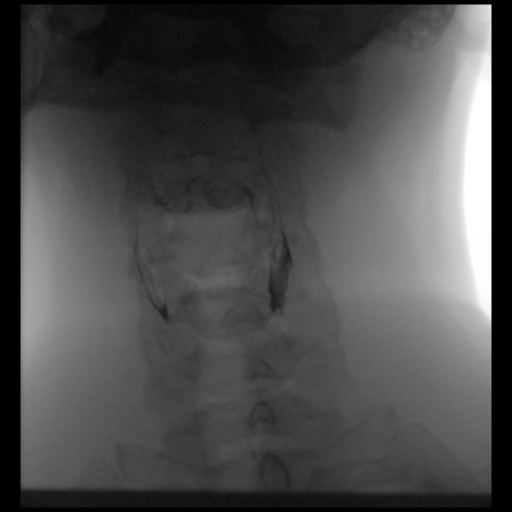
[frame 133/156]
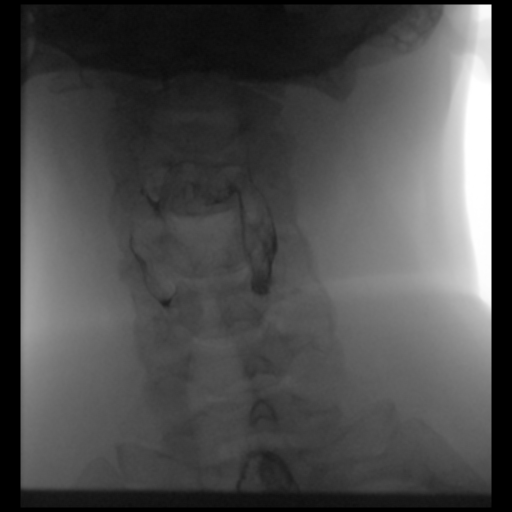

[Series 5: cp_standard · 0.26mm/px · 1 of 169 frames shown (5 of 7)]
[frame 85/169]
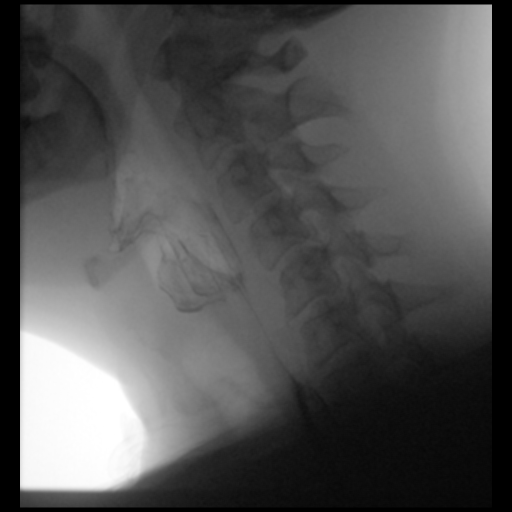

[Series 6: cp_standard · 0.25mm/px · 2 of 164 frames shown (6 of 7)]
[frame 1/164]
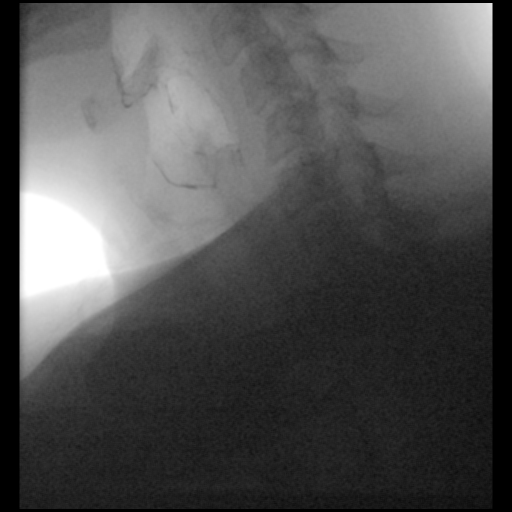
[frame 83/164]
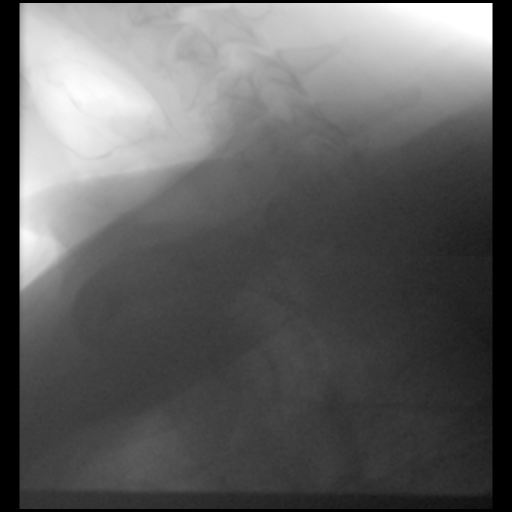

[Series 7: cp_standard · 0.18mm/px · 2 of 175 frames shown (7 of 7)]
[frame 88/175]
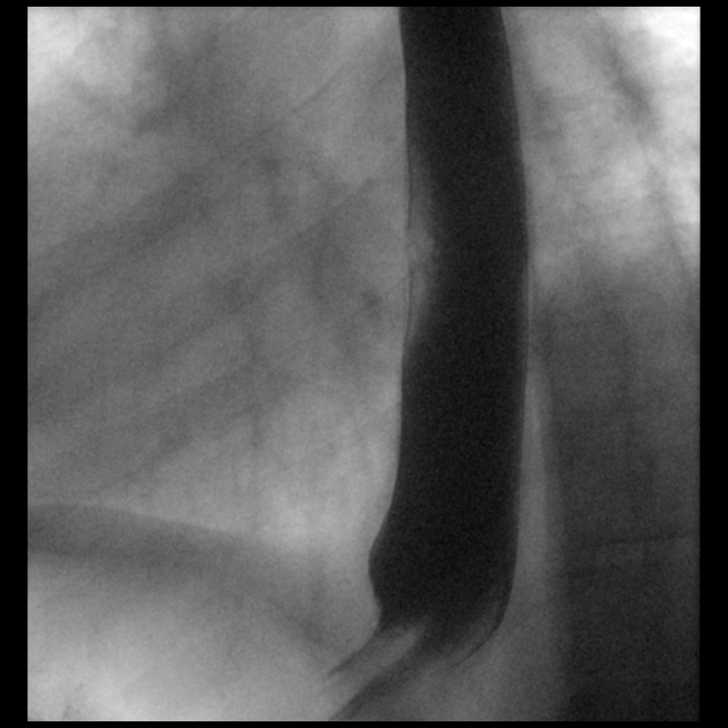
[frame 149/175]
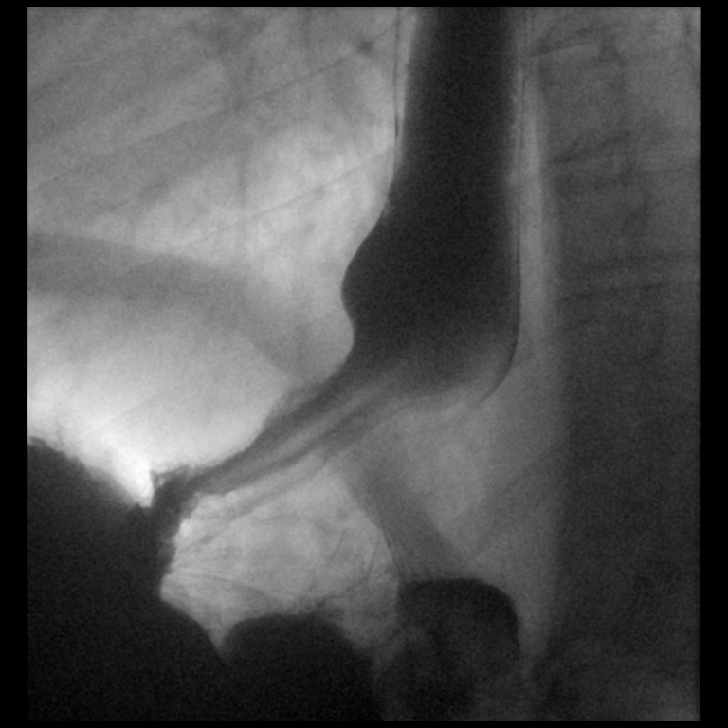

[13 of 24 positions shown; findings below may reference images not displayed]

FINDINGS: Esophageal distention: Normal distention without mass or stricture

Filling defects:  None

12.5 mm barium tablet: Easily passed from oral cavity to stomach
without obstruction

Motility:  Normal

Mucosa:  Smooth without irregularity or ulceration

Hypopharynx/cervical esophagus: No laryngeal penetration or
aspiration. No significant residuals.

Hiatal hernia:  Tiny sliding hiatal hernia transiently noted

GE reflux:  None witnessed during exam

Other:  N/A
IMPRESSION: Transient sliding hiatal hernia incidentally noted during exam.

Otherwise normal study.

## 2020-09-29 ENCOUNTER — Other Ambulatory Visit: Payer: Self-pay

## 2020-09-30 MED ORDER — OMEPRAZOLE 20 MG PO CPDR: DELAYED_RELEASE_CAPSULE | ORAL | 1 refills | Status: AC

## 2020-10-30 ENCOUNTER — Ambulatory Visit: Payer: BC Managed Care – PPO | Admitting: Gastroenterology

## 2020-10-30 ENCOUNTER — Encounter: Payer: Self-pay | Admitting: Internal Medicine

## 2020-11-03 ENCOUNTER — Other Ambulatory Visit: Payer: Self-pay | Admitting: Gastroenterology

## 2020-11-03 ENCOUNTER — Other Ambulatory Visit: Payer: Self-pay | Admitting: Family

## 2020-12-14 ENCOUNTER — Ambulatory Visit: Payer: BC Managed Care – PPO | Admitting: Gastroenterology

## 2021-01-20 ENCOUNTER — Encounter: Payer: Self-pay | Admitting: Gastroenterology

## 2021-01-20 ENCOUNTER — Other Ambulatory Visit: Payer: Self-pay

## 2021-01-20 ENCOUNTER — Ambulatory Visit: Payer: BC Managed Care – PPO | Admitting: Gastroenterology

## 2021-01-20 DIAGNOSIS — K219 Gastro-esophageal reflux disease without esophagitis: Secondary | ICD-10-CM | POA: Diagnosis not present

## 2021-01-20 DIAGNOSIS — K580 Irritable bowel syndrome with diarrhea: Secondary | ICD-10-CM

## 2021-01-20 DIAGNOSIS — K589 Irritable bowel syndrome without diarrhea: Secondary | ICD-10-CM | POA: Insufficient documentation

## 2021-01-20 NOTE — Patient Instructions (Signed)
1. Continue hyoscyamine 1 to 2 tablets before meals and at bedtime to control abdominal pain and diarrhea. 2. Continue omeprazole 20 mg twice daily before a meal for reflux. 3. If episodes of nausea and vomiting increase, or you have increasing abdominal pain, please let us know. 4. Return to the office in 1 year or call sooner if needed.

## 2021-01-20 NOTE — Progress Notes (Unsigned)
Primary Care Physician: Caren Macadam, MD  Primary Gastroenterologist:  Formerly Barney Drain, MD   Chief Complaint  Patient presents with  . vomiting    Occ    HPI: Carlos Wilcox is a 30 y.o. male here for follow-up visit.  Last seen February 2021.  History of large hiatal hernia, nausea/vomiting, diarrhea.  Patient states that he been doing fairly well up until recently and had an episode of vomiting at work. He called in for work note.  He also called back in January for work note regarding diarrhea. Vomiting infrequent but happened the other day at work. Not really having heartburn. Postprandial diarrhea with certain foods. Dairy/greasy foods cause diarrhea.  Has learned what to avoid and overall controls bowel symptoms.  He always has softer loose stools.  On good days typically 3-5 bowel movements per day.  Denies nocturnal BMs.  No melena or rectal bleeding.  Hyoscyamine does help his symptoms.  No further weight loss.  Patient states he saw surgery regarding having his hiatal hernia repaired.  Surgeon felt his hiatal hernia was small and did not require surgical intervention.  EGD September 03, 2019 showed large hiatal hernia suspected to be the cause of his nausea and vomiting.  Proximal stomach oriented right to left orientation and initially distal stomach cannot be evaluated.  Mild erythema noted in the gastric body.  Biopsies showed chronic gastritis consistent with PPI use, no H. pylori.  small bowel biopsies negative for celiac.  Alpha gal negative.  He has had elevated absolute eosinophil count on a couple of occasions, pathology smear review consistent with absolute eosinophilia.  Also has a history of mild transaminitis.  Iron studies, ferritin, HCV antibody all unremarkable.  Hepatitis B surface antibody shows immunity.  Celiac serologies negative.  TSH and free T4 normal.  Colonoscopy March 2021: The examined portion of the ileum was normal. -DIARRHEA DUE TO LEFT  COLITIS -One 5 mm polyp in the hyperplastic rectum, removed with a cold snare. Resected and retrieved. -The rectum is normal. Biopsied (benign colonic mucosa). -Random right colon biopsies with benign colonic mucosa. -Left colon biopsies showed ischemic type changes -Tortuous LEFT colon.  CTA abdomen and pelvis March 2021: No evidence of acute on chronic mesenteric ischemia.  Moderate colonic stool burden.  Suspected hepatic steatosis.  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  Esophagram April 2021 showed transient sliding hiatal hernia. 12.53m barium tablet easily passed.  Gastric emptying study October 2020: Normal  Right upper quadrant ultrasound September 2020: Hepatic steatosis.  Small amount of sludge seen within the contracted gallbladder.     Current Outpatient Medications  Medication Sig Dispense Refill  . escitalopram (LEXAPRO) 20 MG tablet Take 20 mg by mouth daily.    . hyoscyamine (LEVSIN) 0.125 MG tablet Use 1-2 tabs 30 minutes before breakfast, lunch, supper, and at bedtime for diarrhea/abdominal spams. (Patient taking differently: Take 0.25 mg by mouth in the morning, at noon, in the evening, and at bedtime.) 240 tablet 3  . omeprazole (PRILOSEC) 20 MG capsule 1 po 30 mins prior to meals twice daily 180 capsule 1  . ondansetron (ZOFRAN) 4 MG tablet 1-2 po q6h prn nausea/vomiting 45 tablet 1  . promethazine (PHENERGAN) 25 MG tablet TAKE 1 TABLET BY MOUTH EVERY 8 HOURS AS NEEDED FOR NAUSEA OR VOMITING 20 tablet 0   No current facility-administered medications for this visit.  Allergies as of 01/20/2021  . (No Known Allergies)    ROS:  General: Negative for anorexia, weight loss, fever, chills, fatigue, weakness. ENT: Negative for hoarseness, difficulty swallowing , nasal congestion. CV: Negative for chest pain, angina, palpitations, dyspnea on  exertion, peripheral edema.  Respiratory: Negative for dyspnea at rest, dyspnea on exertion, cough, sputum, wheezing.  GI: See history of present illness. GU:  Negative for dysuria, hematuria, urinary incontinence, urinary frequency, nocturnal urination.  Endo: Negative for unusual weight change.    Physical Examination:   BP 125/78   Pulse 80   Temp (!) 97.1 F (36.2 C) (Temporal)   Ht 6' 2"  (1.88 m)   Wt 256 lb (116.1 kg)   BMI 32.87 kg/m   General: Well-nourished, well-developed in no acute distress.  Eyes: No icterus. Mouth:masked Lungs: Clear to auscultation bilaterally.  Heart: Regular rate and rhythm, no murmurs rubs or gallops.  Abdomen: Bowel sounds are normal, nontender, nondistended, no hepatosplenomegaly or masses, no abdominal bruits or hernia , no rebound or guarding.   Extremities: No lower extremity edema. No clubbing or deformities. Neuro: Alert and oriented x 4   Skin: Warm and dry, no jaundice.   Psych: Alert and cooperative, normal mood and affect.   Assessment:  Pleasant 30 year old gentleman with chronic intermittent nausea/vomiting, frequent loose stools.  Extensive work-up as outlined above.  Fortunately his symptoms have become more manageable.  Just recently had episode of vomiting and diarrhea resulting in missed work.  Controls diarrhea with hyoscyamine as well as avoiding food triggers.  Typical reflux symptoms well controlled on omeprazole twice daily.  By EGD he had a large hiatal hernia however by barium esophagram appeared to be tiny.  No surgery offered based on these findings.  Plan:  1. Continue hyoscyamine 1 to 2 tablets before meals and at bedtime to control abdominal pain and diarrhea. 2. Continue omeprazole 20 mg twice daily before a meal for reflux. 3. If episodes of nausea and vomiting increase, or abdominal pain increases, he should let us know.  At that time may consider upper GI series. 4. Return to the office in 1 year or sooner if  needed.

## 2023-12-30 ENCOUNTER — Emergency Department (HOSPITAL_COMMUNITY)
Admission: EM | Admit: 2023-12-30 | Discharge: 2023-12-30 | Disposition: A | Discharge status: Home / Self Care | Payer: 59 | Attending: Emergency Medicine | Admitting: Emergency Medicine

## 2023-12-30 DIAGNOSIS — E1165 Type 2 diabetes mellitus with hyperglycemia: Secondary | ICD-10-CM | POA: Insufficient documentation

## 2023-12-30 DIAGNOSIS — Z7984 Long term (current) use of oral hypoglycemic drugs: Secondary | ICD-10-CM | POA: Diagnosis not present

## 2023-12-30 DIAGNOSIS — E1065 Type 1 diabetes mellitus with hyperglycemia: Secondary | ICD-10-CM | POA: Diagnosis present

## 2023-12-30 DIAGNOSIS — R739 Hyperglycemia, unspecified: Secondary | ICD-10-CM

## 2023-12-30 LAB — URINALYSIS, ROUTINE W REFLEX MICROSCOPIC
Bacteria, UA: NONE SEEN
Bilirubin Urine: NEGATIVE
Glucose, UA: >=500 mg/dL — AB
Hgb urine dipstick: NEGATIVE
Ketones, ur: 20 mg/dL — AB
Leukocytes,Ua: NEGATIVE
Nitrite: NEGATIVE
Protein, ur: NEGATIVE mg/dL
Specific Gravity, Urine: 1.037 — ABNORMAL HIGH (ref 1.005–1.030)
pH: 6 (ref 5.0–8.0)

## 2023-12-30 LAB — CBC WITH DIFFERENTIAL/PLATELET
Abs Immature Granulocytes: 0.01 10*3/uL (ref 0.00–0.07)
Basophils Absolute: 0.1 10*3/uL (ref 0.0–0.1)
Basophils Relative: 1 %
Eosinophils Absolute: 0.1 10*3/uL (ref 0.0–0.5)
Eosinophils Relative: 3 %
HCT: 45.2 % (ref 39.0–52.0)
Hemoglobin: 16.6 g/dL (ref 13.0–17.0)
Immature Granulocytes: 0 %
Lymphocytes Relative: 30 %
Lymphs Abs: 1.4 10*3/uL (ref 0.7–4.0)
MCH: 32.7 pg (ref 26.0–34.0)
MCHC: 36.7 g/dL — ABNORMAL HIGH (ref 30.0–36.0)
MCV: 89.2 fL (ref 80.0–100.0)
Monocytes Absolute: 0.4 10*3/uL (ref 0.1–1.0)
Monocytes Relative: 9 %
Neutro Abs: 2.6 10*3/uL (ref 1.7–7.7)
Neutrophils Relative %: 57 %
Platelets: 236 10*3/uL (ref 150–400)
RBC: 5.07 MIL/uL (ref 4.22–5.81)
RDW: 12 % (ref 11.5–15.5)
WBC: 4.6 10*3/uL (ref 4.0–10.5)
nRBC: 0 % (ref 0.0–0.2)

## 2023-12-30 LAB — BLOOD GAS, VENOUS
Acid-Base Excess: 2.4 mmol/L — ABNORMAL HIGH (ref 0.0–2.0)
Bicarbonate: 27.2 mmol/L (ref 20.0–28.0)
Drawn by: 5133
O2 Saturation: 89.5 %
Patient temperature: 37
pCO2, Ven: 42 mm[Hg] — ABNORMAL LOW (ref 44–60)
pH, Ven: 7.42 (ref 7.25–7.43)
pO2, Ven: 52 mm[Hg] — ABNORMAL HIGH (ref 32–45)

## 2023-12-30 LAB — CBG MONITORING, ED
Glucose-Capillary: 405 mg/dL — ABNORMAL HIGH (ref 70–99)
Glucose-Capillary: 377 mg/dL — ABNORMAL HIGH (ref 70–99)
Glucose-Capillary: 305 mg/dL — ABNORMAL HIGH (ref 70–99)

## 2023-12-30 LAB — BETA-HYDROXYBUTYRIC ACID: Beta-Hydroxybutyric Acid: 1.32 mmol/L — ABNORMAL HIGH (ref 0.05–0.27)

## 2023-12-30 LAB — BASIC METABOLIC PANEL
Anion gap: 13 (ref 5–15)
BUN: 12 mg/dL (ref 6–20)
CO2: 19 mmol/L — ABNORMAL LOW (ref 22–32)
Calcium: 9.3 mg/dL (ref 8.9–10.3)
Chloride: 96 mmol/L — ABNORMAL LOW (ref 98–111)
Creatinine, Ser: 0.72 mg/dL (ref 0.61–1.24)
GFR, Estimated: >60 mL/min (ref >60)
Glucose, Bld: 421 mg/dL — ABNORMAL HIGH (ref 70–99)
Potassium: 3.9 mmol/L (ref 3.5–5.1)
Sodium: 128 mmol/L — ABNORMAL LOW (ref 135–145)

## 2023-12-30 MED ORDER — LACTATED RINGERS IV BOLUS
1000.0000 mL | INTRAVENOUS | Status: AC
  Administered 2023-12-30: 1000 mL via INTRAVENOUS

## 2023-12-30 MED ORDER — DEXTROSE 50 % IV SOLN: 0.0000 mL | INTRAVENOUS | Status: DC | PRN

## 2023-12-30 MED ORDER — METFORMIN HCL 500 MG PO TABS: 500.0000 mg | ORAL_TABLET | Freq: Two times a day (BID) | ORAL | 0 refills | Status: AC

## 2023-12-30 MED ORDER — INSULIN REGULAR(HUMAN) IN NACL 100-0.9 UT/100ML-% IV SOLN
INTRAVENOUS | Status: DC
  Administered 2023-12-30: 8.5 [IU]/h via INTRAVENOUS
  Filled 2023-12-30: qty 100

## 2023-12-30 NOTE — Discharge Instructions (Signed)
Please take metformin as prescribed beginning when you leave here Return if you are having any new or worsening symptoms Please follow-up with your primary care doctor as scheduled on Monday.

## 2023-12-30 NOTE — ED Provider Notes (Signed)
[place] EMERGENCY DEPARTMENT AT [facility] Provider Note   CSN: [id number] Arrival date & time: 12/30/23  4742     History  Chief Complaint  Patient presents with   Hyperglycemia    Carlos Wilcox is a 33 y.o. male.  HPI 33 year old male presents today complaining of hyperglycemia.  He reports that he was seen by his primary care doctor yesterday for a standard checkup.  He was called and told that his blood sugar was high.  He reports that he has had some frequent urination and drinking increased amounts of water.  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  He reports that while he was in prison for several years he had normal blood sugars.  He did have an episode in his late teens where he had DKA.  He states he was on insulin for about 6 months and then was able to come off of it.  It is unclear from talking to him exactly what happened.  He denies any alcohol, tobacco, or illicit drugs.  He denies any recent illness.  He has been eating as per usual.  He reports his weight being stable over the past 6 to 7 months.  He denies fever, chills, nausea, vomiting, diarrhea, URI, cough, dyspnea, chest pain.  He has had some rash on his legs and arms and states that this is "being taken care of after his doctor's visit yesterday.       Home Medications Prior to Admission medications   Medication Sig Start Date End Date Taking? Authorizing Provider  metFORMIN (GLUCOPHAGE) 500 MG tablet Take 1 tablet (500 mg total) by mouth 2 (two) times daily with a meal. 12/30/23  Yes Aroldo Galli, Duwayne Heck, MD  escitalopram (LEXAPRO) 20 MG tablet Take 20 mg by mouth daily. 12/10/20   [provider]  hyoscyamine (LEVSIN) 0.125 MG tablet Use 1-2 tabs 30 minutes before breakfast, lunch, supper, and at bedtime for diarrhea/abdominal spams. Patient taking differently: Take 0.25 mg by mouth in the morning, at noon, in the evening, and at bedtime. 01/07/20   Tiffany Kocher, PA-C  omeprazole (PRILOSEC) 20  MG capsule 1 po 30 mins prior to meals twice daily 09/30/20   Anice Paganini, NP  ondansetron (ZOFRAN) 4 MG tablet 1-2 po q6h prn nausea/vomiting 02/11/20   Fields, Darleene Cleaver, MD  promethazine (PHENERGAN) 25 MG tablet TAKE 1 TABLET BY MOUTH EVERY 8 HOURS AS NEEDED FOR NAUSEA OR VOMITING 11/03/20   Eulis Foster, FNP      Allergies    Patient has no known allergies.    Review of Systems   Review of Systems  Physical Exam Updated Vital Signs BP 134/83   Pulse 80   Temp 98.6 F (37 C)   Resp 15   Ht 1.854 m (6\' 1" )   Wt 102.1 kg   SpO2 97%   BMI 29.69 kg/m  Physical Exam Vitals and nursing note reviewed.  Constitutional:      General: He is not in acute distress.    Appearance: Normal appearance. He is not ill-appearing or toxic-appearing.  HENT:     Head: Normocephalic and atraumatic.     Right Ear: Tympanic membrane, ear canal and external ear normal.     Left Ear: Tympanic membrane, ear canal and external ear normal.     Nose: Nose normal.     Mouth/Throat:     Mouth: Mucous membranes are moist.     Pharynx: Oropharynx is clear.  Eyes:  Extraocular Movements: Extraocular movements intact.     Conjunctiva/sclera: Conjunctivae normal.     Pupils: Pupils are equal, round, and reactive to light.  Cardiovascular:     Rate and Rhythm: Normal rate and regular rhythm.     Pulses: Normal pulses.     Heart sounds: Normal heart sounds.  Pulmonary:     Effort: Pulmonary effort is normal.  Abdominal:     General: Abdomen is flat. Bowel sounds are normal.     Palpations: Abdomen is soft.  Musculoskeletal:        General: Normal range of motion.     Cervical back: Normal range of motion and neck supple.  Skin:    General: Skin is warm and dry.     Capillary Refill: Capillary refill takes less than 2 seconds.     Findings: Lesion present.     Comments: Several small maculopapular lesions on his arm some with central scabbing no evidence of spreading erythema, or acute infection  of these areas  Neurological:     General: No focal deficit present.     Mental Status: He is alert and oriented to person, place, and time. Mental status is at baseline.     Cranial Nerves: No cranial nerve deficit.     Motor: No weakness.     Coordination: Coordination normal.     Gait: Gait normal.     Deep Tendon Reflexes: Reflexes normal.  Psychiatric:        Mood and Affect: Mood normal.        Behavior: Behavior normal.     ED Results / Procedures / Treatments   Labs (all labs ordered are listed, but only abnormal results are displayed) Labs Reviewed  BASIC METABOLIC PANEL - Abnormal; Notable for the following components:      Result Value   Sodium 128 (*)    Chloride 96 (*)    CO2 19 (*)    Glucose, Bld 421 (*)    All other components within normal limits  CBC WITH DIFFERENTIAL/PLATELET - Abnormal; Notable for the following components:   MCHC 36.7 (*)    All other components within normal limits  URINALYSIS, ROUTINE W REFLEX MICROSCOPIC - Abnormal; Notable for the following components:   Color, Urine STRAW (*)    Specific Gravity, Urine 1.037 (*)    Glucose, UA >=500 (*)    Ketones, ur 20 (*)    All other components within normal limits  BETA-HYDROXYBUTYRIC ACID - Abnormal; Notable for the following components:   Beta-Hydroxybutyric Acid 1.32 (*)    All other components within normal limits  BLOOD GAS, VENOUS - Abnormal; Notable for the following components:   pCO2, Ven 42 (*)    pO2, Ven 52 (*)    Acid-Base Excess 2.4 (*)    All other components within normal limits  CBG MONITORING, ED - Abnormal; Notable for the following components:   Glucose-Capillary 405 (*)    All other components within normal limits  CBG MONITORING, ED - Abnormal; Notable for the following components:   Glucose-Capillary 377 (*)    All other components within normal limits  CBG MONITORING, ED - Abnormal; Notable for the following components:   Glucose-Capillary 305 (*)    All other  components within normal limits    EKG EKG Interpretation Date/Time:  Saturday December 30 2023 07:34:01 EST Ventricular Rate:  82 PR Interval:  160 QRS Duration:  96 QT Interval:  360 QTC Calculation: 421 R Axis:   -  5  Text Interpretation: Sinus rhythm Confirmed by Margarita Grizzle [phone]) on 12/30/2023 7:39:44 AM  Radiology No results found.  Procedures .Critical Care  Performed by: Margarita Grizzle, MD Authorized by: Margarita Grizzle, MD   Critical care provider statement:    Critical care time (minutes):  30   Critical care end time:  12/30/2023 10:06 AM   Critical care time was exclusive of:  Separately billable procedures and treating other patients   Critical care was necessary to treat or prevent imminent or life-threatening deterioration of the following conditions:  Endocrine crisis   Critical care was time spent personally by me on the following activities:  Development of treatment plan with patient or surrogate, evaluation of patient's response to treatment, examination of patient, ordering and performing treatments and interventions, ordering and review of laboratory studies, pulse oximetry, re-evaluation of patient's condition and review of old charts     Medications Ordered in ED Medications  dextrose 50 % solution 0-50 mL (has no administration in time range)  lactated ringers bolus 1,000 mL (0 mLs Intravenous Stopped 12/30/23 0958)  insulin regular, human (MYXREDLIN) 100 units/ 100 mL infusion (8.5 Units/hr Intravenous New Bag/Given 12/30/23 0910)    ED Course/ Medical Decision Making/ A&P Clinical Course as of 12/30/23 1007  Sat Dec 30, 2023  1003 VBG reviewed interpreted send for pH 7.42 pCO2 42 pO2 52 Basic metabolic panel reviewed interpreted significant for sodium 128 with glucose 421 and CO2 slightly decreased at 19  [DR]  1004 Beta hydroxybutyric acid slightly elevated at 1.32 Urine is significant for Leukos greater than 500 and ketones of 20 CBC reviewed  interpreted and normal [DR]    Clinical Course User Index [DR] Margarita Grizzle, MD                                 Medical Decision Making Amount and/or Complexity of Data Reviewed Labs: ordered.  Risk Prescription drug management.   33 year old male history of DKA and type 2 diabetes presents today with hyperglycemia.  He was not really symptomatic and was seen by primary care doctor and this was picked up on screening exam.  However, retrospectively, he does note that he has had some increased water intake and increased urination.  Otherwise, he has been well with no reports of recent infection, MI, or other acute problems. He is evaluated here in the ED Doubt DKA with normal pH, beta hydroxybutyric acid slightly elevated at 1.32 but is less than 3 and there are 20 ketones in his urine Patient was treated here with IV fluids and IV insulin.  Glucose has begun to decrease Patient appears hemodynamically stable.  Based on above I doubt that he is a DKA. Plan to start metformin.  He has outpatient follow-up arranged for Monday.  We have discussed return precautions and need for follow-up and he voices understanding       Final Clinical Impression(s) / ED Diagnoses Final diagnoses:  Hyperglycemia    Rx / DC Orders ED Discharge Orders          Ordered    metFORMIN (GLUCOPHAGE) 500 MG tablet  2 times daily with meals        12/30/23 1007              Margarita Grizzle, MD 12/30/23 1007

## 2023-12-30 NOTE — ED Triage Notes (Signed)
Pt reports that he had a physical yesterday and was called by his PCP this morning d/t his blood glucose being in the mid 400's. Pt reports not being fasted for his blood work. Pt reports that as a teenager he was briefly diagnosed with T1DM and had episode of DKA, but was able to transition off of insulin after six months and no longer has diagnosis of DM. Pt endorses polydipsia and polyuria. No recent infections.

## 2024-04-23 ENCOUNTER — Ambulatory Visit
